# Patient Record
Sex: Male | Born: 1955 | Race: White | Hispanic: No | Marital: Married | Smoking: Never smoker
Health system: Southern US, Community
[De-identification: ages and names within clinical notes are randomized; demographics above are authoritative.]

## PROBLEM LIST (undated history)

## (undated) DIAGNOSIS — R748 Abnormal levels of other serum enzymes: Principal | ICD-10-CM

## (undated) DIAGNOSIS — R932 Abnormal findings on diagnostic imaging of liver and biliary tract: Principal | ICD-10-CM

## (undated) DIAGNOSIS — Z5181 Encounter for therapeutic drug level monitoring: Secondary | ICD-10-CM

## (undated) DIAGNOSIS — E871 Hypo-osmolality and hyponatremia: Principal | ICD-10-CM

## (undated) DIAGNOSIS — K8020 Calculus of gallbladder without cholecystitis without obstruction: Principal | ICD-10-CM

## (undated) DIAGNOSIS — I10 Essential (primary) hypertension: Secondary | ICD-10-CM

## (undated) DIAGNOSIS — K8010 Calculus of gallbladder with chronic cholecystitis without obstruction: Secondary | ICD-10-CM

## (undated) HISTORY — PX: ELBOW SURGERY: SHX618

---

## 2011-12-08 ENCOUNTER — Ambulatory Visit: Payer: Managed Care, Other (non HMO)

## 2011-12-08 ENCOUNTER — Ambulatory Visit: Payer: Managed Care, Other (non HMO) | Admitting: Family Medicine

## 2011-12-08 VITALS — BP 121/78 | HR 79 | Temp 98.1°F | Resp 16 | Ht 71.5 in | Wt 195.2 lb

## 2011-12-08 DIAGNOSIS — J189 Pneumonia, unspecified organism: Secondary | ICD-10-CM

## 2011-12-08 DIAGNOSIS — R059 Cough, unspecified: Secondary | ICD-10-CM

## 2011-12-08 DIAGNOSIS — R05 Cough: Secondary | ICD-10-CM

## 2011-12-08 MED ORDER — AZITHROMYCIN 250 MG PO TABS: ORAL_TABLET | ORAL | Status: AC

## 2011-12-08 MED ORDER — HYDROCODONE-HOMATROPINE 5-1.5 MG/5ML PO SYRP: 5.0000 mL | ORAL_SOLUTION | Freq: Three times a day (TID) | ORAL | Status: AC | PRN

## 2011-12-08 NOTE — Progress Notes (Signed)
@  [email or web address]@   Patient ID: Mark Larsen MRN: [id number], DOB: October 11, 1955, 56 y.o. Date of Encounter: 12/08/2011, 11:05 AM  Primary Physician: No primary provider on file.  Chief Complaint:  Chief Complaint  Patient presents with  . Headache    x 5 days  . Cough    green sputum little tight in chest    HPI: 56 y.o. year old male presents with a 5 day history of nasal congestion, post nasal drip, sore throat, and cough. Mild sinus pressure. Afebrile. No chills. Nasal congestion thick and green/yellow. Cough is productive of green/yellow sputum and not associated with time of day. Ears feel full, leading to sensation of muffled hearing. Has tried OTC cold preps without success. No GI complaints. Appetite fair  No sick contacts, recent antibiotics, or recent travels.   No leg trauma, sedentary periods, h/o cancer, or tobacco use.  No past medical history on file.   Home Meds: Prior to Admission medications   Medication Sig Start Date End Date Taking? Authorizing Provider  fish oil-omega-3 fatty acids 1000 MG capsule Take 2 g by mouth daily.   Yes Historical Provider, MD    Allergies: No Known Allergies  History   Social History  . Marital Status: Married    Spouse Name: N/A    Number of Children: N/A  . Years of Education: N/A   Occupational History  . Not on file.   Social History Main Topics  . Smoking status: Never Smoker   . Smokeless tobacco: Not on file  . Alcohol Use: Not on file  . Drug Use: Not on file  . Sexually Active: Not on file   Other Topics Concern  . Not on file   Social History Narrative  . No narrative on file     Review of Systems: Constitutional: negative for chills, fever, night sweats or weight changes Cardiovascular: negative for chest pain or palpitations Respiratory: negative for hemoptysis, or shortness of breath Abdominal: negative for abdominal pain, nausea, vomiting or diarrhea Dermatological: negative for rash Neurologic:  negative for headache   Physical Exam: Blood pressure 121/78, pulse 79, temperature 98.1 F (36.7 C), temperature source Oral, resp. rate 16, height 5' 11.5" (1.816 m), weight 195 lb 3.2 oz (88.542 kg), SpO2 98.00%., Body mass index is 26.85 kg/(m^2). General: Well developed, well nourished, in no acute distress. Head: Normocephalic, atraumatic, eyes without discharge, sclera non-icteric, nares are congested. Bilateral auditory canals clear, TM's are without perforation, pearly grey with reflective cone of light bilaterally. No sinus TTP. Oral cavity moist, dentition normal. Posterior pharynx with post nasal drip and mild erythema. No peritonsillar abscess or tonsillar exudate. Neck: Supple. No thyromegaly. Full ROM. No lymphadenopathy. Lungs: Decreased breath sounds bilaterally.  Rales left base Heart: RRR with S1 S2. No murmurs, rubs, or gallops appreciated. Msk:  Strength and tone normal for age. Extremities: No clubbing or cyanosis. No edema. Neuro: Alert and oriented X 3. Moves all extremities spontaneously. CNII-XII grossly in tact. Psych:  Responds to questions appropriately with a normal affect.   [facility] reading (PRIMARY) by  Dr. Milus Glazier: CXR-.left lower lobe infiltrate     ASSESSMENT AND PLAN:  56 y.o. year old male with bronchitis. 1. Cough  DG Chest 2 View, azithromycin (ZITHROMAX Z-PAK) 250 MG tablet, HYDROcodone-homatropine (HYCODAN) 5-1.5 MG/5ML syrup    - -Mucinex -Tylenol/Motrin prn -Rest/fluids -RTC precautions -RTC 3-5 days if no improvement  Signed, Elvina Sidle, MD 12/08/2011 11:05 AM

## 2012-06-13 ENCOUNTER — Ambulatory Visit: Payer: Managed Care, Other (non HMO) | Admitting: Emergency Medicine

## 2012-06-13 VITALS — BP 145/84 | HR 95 | Temp 98.3°F | Resp 18 | Ht 71.0 in | Wt 197.2 lb

## 2012-06-13 DIAGNOSIS — J209 Acute bronchitis, unspecified: Secondary | ICD-10-CM

## 2012-06-13 DIAGNOSIS — J018 Other acute sinusitis: Secondary | ICD-10-CM

## 2012-06-13 MED ORDER — HYDROCOD POLST-CHLORPHEN POLST 10-8 MG/5ML PO LQCR: 5.0000 mL | Freq: Two times a day (BID) | ORAL | Status: AC | PRN

## 2012-06-13 MED ORDER — AMOXICILLIN-POT CLAVULANATE 875-125 MG PO TABS: 1.0000 | ORAL_TABLET | Freq: Two times a day (BID) | ORAL | Status: DC

## 2012-06-13 MED ORDER — PSEUDOEPHEDRINE-GUAIFENESIN ER 60-600 MG PO TB12: 1.0000 | ORAL_TABLET | Freq: Two times a day (BID) | ORAL | Status: AC

## 2012-06-13 NOTE — Patient Instructions (Signed)

## 2012-06-13 NOTE — Progress Notes (Signed)
[facility] [address] [place] [address] [phone]  Date:  06/13/2012   Name:  Mark Larsen   DOB:  18-Jan-1956   MRN:  [id number]  PCP:  No primary provider on file.    Chief Complaint: Cough, Nasal Congestion and Sore Throat   History of Present Illness:  Quron Ruddy is a 57 y.o. very pleasant male patient who presents with the following:  1 week long upper respiratory infection. Has green nasal drainage and congestion over past two days.  Now has a cough productive purulent sputum.  No fever or chills. No shortness of breath or wheezing. No nausea or vomiting.  There is no problem list on file for this patient.   History reviewed. No pertinent past medical history.  History reviewed. No pertinent past surgical history.  History  Substance Use Topics  . Smoking status: Never Smoker   . Smokeless tobacco: Not on file  . Alcohol Use: Not on file    No family history on file.  No Known Allergies  Medication list has been reviewed and updated.  Current Outpatient Prescriptions on File Prior to Visit  Medication Sig Dispense Refill  . fish oil-omega-3 fatty acids 1000 MG capsule Take 2 g by mouth daily.       No current facility-administered medications on file prior to visit.    Review of Systems:  As per HPI, otherwise negative.    Physical Examination: Filed Vitals:   06/13/12 1243  BP: 145/84  Pulse: 95  Temp: 98.3 F (36.8 C)  Resp: 18   Filed Vitals:   06/13/12 1243  Height: 5\' 11"  (1.803 m)  Weight: 197 lb 3.2 oz (89.449 kg)   Body mass index is 27.52 kg/(m^2). Ideal Body Weight: Weight in (lb) to have BMI = 25: 178.9  GEN: WDWN, NAD, Non-toxic, A & O x 3 HEENT: Atraumatic, Normocephalic. Neck supple. No masses, No LAD. Ears and Nose: No external deformity. CV: RRR, No M/G/R. No JVD. No thrill. No extra heart sounds. PULM: CTA B, no wheezes, crackles, rhonchi. No retractions. No resp. distress. No accessory  muscle use. ABD: S, NT, ND, +BS. No rebound. No HSM. EXTR: No c/c/e NEURO Normal gait.  PSYCH: Normally interactive. Conversant. Not depressed or anxious appearing.  Calm demeanor.    Assessment and Plan: Sinusitis Bronchitis augmentin mucinex tussionex  Carmelina Dane, MD

## 2013-03-05 ENCOUNTER — Ambulatory Visit: Payer: Managed Care, Other (non HMO) | Admitting: Internal Medicine

## 2013-03-05 ENCOUNTER — Ambulatory Visit: Payer: Managed Care, Other (non HMO)

## 2013-03-05 VITALS — BP 122/80 | HR 80 | Temp 98.6°F | Resp 20 | Ht 71.0 in | Wt 197.8 lb

## 2013-03-05 DIAGNOSIS — J329 Chronic sinusitis, unspecified: Secondary | ICD-10-CM

## 2013-03-05 DIAGNOSIS — R918 Other nonspecific abnormal finding of lung field: Secondary | ICD-10-CM

## 2013-03-05 DIAGNOSIS — R9389 Abnormal findings on diagnostic imaging of other specified body structures: Secondary | ICD-10-CM

## 2013-03-05 MED ORDER — AMOXICILLIN 500 MG PO CAPS: 1000.0000 mg | ORAL_CAPSULE | Freq: Two times a day (BID) | ORAL | Status: AC

## 2013-03-05 NOTE — Patient Instructions (Signed)

## 2013-03-05 NOTE — Progress Notes (Signed)
  Subjective:    Patient ID: Mark Larsen, male    DOB: 13-Mar-1956, 57 y.o.   MRN: [id number]  HPI pt here c/o of sinusitis, headache, drainage (green, brown). Pt also has noticed some nose bleeding not to heavy.Started Saturday.  Has been taking Claritin d, and Mucinex. With no relief  Drinking and eating.  Denies fever, chills, or cough. No weight loss, anorexia, fatigue. Chart review abnormal cxr early this year see report  Review of Systems     Objective:   Physical Exam  Constitutional: He is oriented to person, place, and time. He appears well-developed and well-nourished.  HENT:  Head: Normocephalic.  Right Ear: External ear normal.  Left Ear: External ear normal.  Nose: Mucosal edema, rhinorrhea and sinus tenderness present. Right sinus exhibits frontal sinus tenderness. Right sinus exhibits no maxillary sinus tenderness. Left sinus exhibits frontal sinus tenderness. Left sinus exhibits no maxillary sinus tenderness.  Mouth/Throat: Oropharynx is clear and moist.  Eyes: Conjunctivae and EOM are normal. Pupils are equal, round, and reactive to light.  Neck: Normal range of motion. Neck supple.  Cardiovascular: Normal rate, normal heart sounds and intact distal pulses.   Pulmonary/Chest: Effort normal and breath sounds normal.  Musculoskeletal: Normal range of motion.  Lymphadenopathy:    He has no cervical adenopathy.  Neurological: He is alert and oriented to person, place, and time. No cranial nerve deficit. He exhibits normal muscle tone. Coordination normal.  Psychiatric: He has a normal mood and affect.      *RADIOLOGY REPORT*  Clinical Data: Cough  CHEST - 2 VIEW  Comparison: None.  Findings:  Normal cardiac silhouette and mediastinal contours. Lungs are  hyperexpanded with flattening of the bilateral hemidiaphragms.  Minimal left basilar heterogeneous opacities. No definite pleural  effusion or pneumothorax. Multilevel thoracic spine flowing  osteophytosis with  preservation of disc spaces, suggestive of DISH.  IMPRESSION:  1. Left basilar heterogeneous opacities, while possibly  atelectasis are worrisome for infection. A follow-up chest  radiograph in 4 to 6 weeks after treatment is recommended to ensure  resolution.  2. Hyperexpanded lungs.  Clinically significant discrepancy from primary report, if  provided: None  Original Report Authenticated  [facility] reading (PRIMARY) by  Dr.Allannah Kempen cxr now clear.      Assessment & Plan:  Sinusitis/Amoxil 1g BID Abnormal cxr now normal

## 2013-08-02 ENCOUNTER — Ambulatory Visit: Payer: Managed Care, Other (non HMO)

## 2015-03-27 ENCOUNTER — Ambulatory Visit (INDEPENDENT_AMBULATORY_CARE_PROVIDER_SITE_OTHER): Payer: 59 | Admitting: Physician Assistant

## 2015-03-27 VITALS — BP 120/72 | HR 66 | Temp 97.9°F | Resp 18 | Ht 71.0 in | Wt 194.0 lb

## 2015-03-27 DIAGNOSIS — J019 Acute sinusitis, unspecified: Secondary | ICD-10-CM | POA: Diagnosis not present

## 2015-03-27 MED ORDER — IPRATROPIUM BROMIDE 0.03 % NA SOLN: 2.0000 | Freq: Two times a day (BID) | NASAL | Status: AC

## 2015-03-27 MED ORDER — GUAIFENESIN ER 1200 MG PO TB12: 1.0000 | ORAL_TABLET | Freq: Two times a day (BID) | ORAL | Status: AC | PRN

## 2015-03-27 MED ORDER — AMOXICILLIN-POT CLAVULANATE 875-125 MG PO TABS: 1.0000 | ORAL_TABLET | Freq: Two times a day (BID) | ORAL | Status: AC

## 2015-03-27 NOTE — Progress Notes (Signed)
[facility] [address] [place] [address] [phone]  Date:  03/27/2015   Name:  Mark Larsen   DOB:  07/18/1955   MRN:  [id number]  PCP:  No primary care provider on file.   Chief Complaint  Patient presents with  . Sinusitis    x1 week   . Headache  . sinus pressure  . Nasal Congestion    yellow mucous     History of Present Illness:  Mark Larsen is a 59 y.o. male patient who presents to [facility] for cc of sinus pressure and headache for the last 4 days.  He has a burning sensation in the nostrils.  Yellow mucus.  Taking claritin D and flonase which helps.   There is no cough.  Uses a nedi pot, though not a lot regularly.  No fever.  He is travelling considerably by flight.    There are no active problems to display for this patient.   History reviewed. No pertinent past medical history.  Past Surgical History  Procedure Laterality Date  . Elbow surgery Right     Social History  Substance Use Topics  . Smoking status: Never Smoker   . Smokeless tobacco: None  . Alcohol Use: None    Family History  Problem Relation Age of Onset  . Heart disease Father     Daine Gip bipass, pacemaker    No Known Allergies  Medication list has been reviewed and updated.  Current Outpatient Prescriptions on File Prior to Visit  Medication Sig Dispense Refill  . fish oil-omega-3 fatty acids 1000 MG capsule Take 2 g by mouth daily.    Marland Kitchen loratadine-pseudoephedrine (CLARITIN-D 12-HOUR) 5-120 MG per tablet Take 1 tablet by mouth 2 (two) times daily.    Marland Kitchen amoxicillin (AMOXIL) 500 MG capsule Take 2 capsules (1,000 mg total) by mouth 2 (two) times daily. (Patient not taking: Reported on 03/27/2015) 40 capsule 1  . amoxicillin-clavulanate (AUGMENTIN) 875-125 MG per tablet Take 1 tablet by mouth 2 (two) times daily. (Patient not taking: Reported on 03/27/2015) 20 tablet 0  . chlorpheniramine-HYDROcodone (TUSSIONEX PENNKINETIC ER) 10-8 MG/5ML LQCR Take 5 mLs by  mouth every 12 (twelve) hours as needed (cough). (Patient not taking: Reported on 03/27/2015) 60 mL 0   No current facility-administered medications on file prior to visit.    ROS ROS otherwise unremarkable unless listed above.   Physical Examination: BP 120/72 mmHg  Pulse 66  Temp(Src) 97.9 F (36.6 C) (Oral)  Resp 18  Ht  (1.803 m)  Wt 194 lb (87.998 kg)  BMI 27.07 kg/m2  SpO2 99% Ideal Body Weight: Weight in (lb) to have BMI = 25: 178.9  Physical Exam  Constitutional: He is oriented to person, place, and time. He appears well-developed and well-nourished. No distress.  HENT:  Head: Atraumatic.  Right Ear: Tympanic membrane, external ear and ear canal normal.  Left Ear: Tympanic membrane, external ear and ear canal normal.  Nose: Mucosal edema and rhinorrhea present. Right sinus exhibits maxillary sinus tenderness. Right sinus exhibits no frontal sinus tenderness. Left sinus exhibits maxillary sinus tenderness. Left sinus exhibits no frontal sinus tenderness.  Mouth/Throat: No uvula swelling. No oropharyngeal exudate, posterior oropharyngeal edema or posterior oropharyngeal erythema.  Eyes: Conjunctivae, EOM and lids are normal. Pupils are equal, round, and reactive to light. Right eye exhibits normal extraocular motion. Left eye exhibits normal extraocular motion.  Neck: Trachea normal and full passive range of motion without pain. No edema and  no erythema present.  Cardiovascular: Normal rate.   Pulmonary/Chest: Effort normal. No respiratory distress. He has no decreased breath sounds. He has no wheezes. He has no rhonchi.  Neurological: He is alert and oriented to person, place, and time.  Skin: Skin is warm and dry. He is not diaphoretic.  Psychiatric: He has a normal mood and affect. His behavior is normal.     Assessment and Plan: Mark Larsen is a 59 y.o. male who is here today for cc of sinus pressure and nasal congestion. Treating for bacterial etiology.   Supportive treatment listed below.   Subacute sinusitis, unspecified location - Plan: Guaifenesin (MUCINEX MAXIMUM STRENGTH) 1200 MG TB12, amoxicillin-clavulanate (AUGMENTIN) 875-125 MG tablet, ipratropium (ATROVENT) 0.03 % nasal spray   Trena PlattStephanie Junious Ragone, PA-C [facility] 03/27/2015 11:26 AM

## 2015-03-27 NOTE — Patient Instructions (Signed)
Please use the nasal saline during the day.   Please use the medication as prescribed. Make sure that you are hydrating very well.    Sinusitis, Adult Sinusitis is redness, soreness, and inflammation of the paranasal sinuses. Paranasal sinuses are air pockets within the bones of your face. They are located beneath your eyes, in the middle of your forehead, and above your eyes. In healthy paranasal sinuses, mucus is able to drain out, and air is able to circulate through them by way of your nose. However, when your paranasal sinuses are inflamed, mucus and air can become trapped. This can allow bacteria and other germs to grow and cause infection. Sinusitis can develop quickly and last only a short time (acute) or continue over a long period (chronic). Sinusitis that lasts for more than 12 weeks is considered chronic. CAUSES Causes of sinusitis include:  Allergies.  Structural abnormalities, such as displacement of the cartilage that separates your nostrils (deviated septum), which can decrease the air flow through your nose and sinuses and affect sinus drainage.  Functional abnormalities, such as when the small hairs (cilia) that line your sinuses and help remove mucus do not work properly or are not present. SIGNS AND SYMPTOMS Symptoms of acute and chronic sinusitis are the same. The primary symptoms are pain and pressure around the affected sinuses. Other symptoms include:  Upper toothache.  Earache.  Headache.  Bad breath.  Decreased sense of smell and taste.  A cough, which worsens when you are lying flat.  Fatigue.  Fever.  Thick drainage from your nose, which often is green and may contain pus (purulent).  Swelling and warmth over the affected sinuses. DIAGNOSIS Your health care provider will perform a physical exam. During your exam, your health care provider may perform any of the following to help determine if you have acute sinusitis or chronic sinusitis:  Look in  your nose for signs of abnormal growths in your nostrils (nasal polyps).  Tap over the affected sinus to check for signs of infection.  View the inside of your sinuses using an imaging device that has a light attached (endoscope). If your health care provider suspects that you have chronic sinusitis, one or more of the following tests may be recommended:  Allergy tests.  Nasal culture. A sample of mucus is taken from your nose, sent to a lab, and screened for bacteria.  Nasal cytology. A sample of mucus is taken from your nose and examined by your health care provider to determine if your sinusitis is related to an allergy. TREATMENT Most cases of acute sinusitis are related to a viral infection and will resolve on their own within 10 days. Sometimes, medicines are prescribed to help relieve symptoms of both acute and chronic sinusitis. These may include pain medicines, decongestants, nasal steroid sprays, or saline sprays. However, for sinusitis related to a bacterial infection, your health care provider will prescribe antibiotic medicines. These are medicines that will help kill the bacteria causing the infection. Rarely, sinusitis is caused by a fungal infection. In these cases, your health care provider will prescribe antifungal medicine. For some cases of chronic sinusitis, surgery is needed. Generally, these are cases in which sinusitis recurs more than 3 times per year, despite other treatments. HOME CARE INSTRUCTIONS  Drink plenty of water. Water helps thin the mucus so your sinuses can drain more easily.  Use a humidifier.  Inhale steam 3-4 times a day (for example, sit in the bathroom with the shower running).  Apply a warm, moist washcloth to your face 3-4 times a day, or as directed by your health care provider.  Use saline nasal sprays to help moisten and clean your sinuses.  Take medicines only as directed by your health care provider.  If you were prescribed either an  antibiotic or antifungal medicine, finish it all even if you start to feel better. SEEK IMMEDIATE MEDICAL CARE IF:  You have increasing pain or severe headaches.  You have nausea, vomiting, or drowsiness.  You have swelling around your face.  You have vision problems.  You have a stiff neck.  You have difficulty breathing.   This information is not intended to replace advice given to you by your health care provider. Make sure you discuss any questions you have with your health care provider.   Document Released: 04/15/2005 Document Revised: 05/06/2014 Document Reviewed: 04/30/2011 Elsevier Interactive Patient Education [employer].

## 2021-10-18 ENCOUNTER — Ambulatory Visit
Admit: 2021-10-18 | Discharge: 2021-10-18 | Payer: PRIVATE HEALTH INSURANCE | Attending: Family Medicine | Primary: Family Medicine

## 2021-10-18 DIAGNOSIS — Z0000 Encounter for general adult medical examination without abnormal findings: Secondary | ICD-10-CM

## 2021-10-18 LAB — CBC WITH AUTO DIFFERENTIAL
Absolute Baso #: 0.1 10*3/uL (ref 0.0–0.2)
Absolute Eos #: 0.1 10*3/uL (ref 0.0–0.5)
Absolute Lymph #: 2.1 10*3/uL (ref 1.0–3.2)
Absolute Mono #: 0.5 10*3/uL (ref 0.3–1.0)
Basophils %: 1.1 % (ref 0.0–2.0)
Eosinophils %: 2.1 % (ref 0.0–7.0)
Hematocrit: 46.1 % (ref 38.0–52.0)
Hemoglobin: 16.2 g/dL (ref 13.0–17.3)
Immature Grans (Abs): 0.03 10*3/uL (ref 0.00–0.06)
Immature Granulocytes: 0.5 % (ref 0.0–0.6)
Lymphocytes: 32.7 % (ref 15.0–45.0)
MCH: 31.5 pg (ref 27.0–34.5)
MCHC: 35.1 g/dL (ref 32.0–36.0)
MCV: 89.7 fL (ref 84.0–100.0)
MPV: 10.6 fL (ref 7.2–13.2)
Monocytes: 7.7 % (ref 4.0–12.0)
NRBC Absolute: 0 10*3/uL (ref 0.000–0.012)
NRBC Automated: 0 % (ref 0.0–0.2)
Neutrophils %: 55.9 % (ref 42.0–74.0)
Neutrophils Absolute: 3.5 10*3/uL (ref 1.6–7.3)
Platelets: 243 10*3/uL (ref 140–440)
RBC: 5.14 x10e6/mcL (ref 4.00–5.60)
RDW: 12.6 % (ref 11.0–16.0)
WBC: 6.3 10*3/uL (ref 3.8–10.6)

## 2021-10-18 LAB — COMPREHENSIVE METABOLIC PANEL
ALT: 46 U/L (ref 0–50)
AST: 25 U/L (ref 0–50)
Albumin/Globulin Ratio: 2.6 (ref 1.00–2.70)
Albumin: 4.6 g/dL (ref 3.5–5.2)
Alk Phosphatase: 86 U/L (ref 40–130)
Anion Gap: 12 mmol/L (ref 2–17)
BUN: 15 mg/dL (ref 8–23)
CO2: 26 mmol/L (ref 22–29)
Calcium: 9 mg/dL (ref 8.8–10.2)
Chloride: 100 mmol/L (ref 98–107)
Creatinine: 1.1 mg/dL (ref 0.7–1.3)
Est, Glom Filt Rate: 75 mL/min/1.73m (ref >=60)
Globulin: 1.8 g/dL — ABNORMAL LOW (ref 1.9–4.4)
Glucose: 119 mg/dL — ABNORMAL HIGH (ref 70–99)
OSMOLALITY CALCULATED: 277 mOsm/kg (ref 270–287)
Potassium: 4.2 mmol/L (ref 3.5–5.3)
Sodium: 138 mmol/L (ref 135–145)
Total Bilirubin: 0.46 mg/dL (ref 0.00–1.20)
Total Protein: 6.4 g/dL (ref 6.4–8.3)

## 2021-10-18 LAB — TSH WITH REFLEX: TSH: 2.28 mcIU/mL (ref 0.358–3.740)

## 2021-10-18 LAB — LIPID PANEL
Chol/HDL Ratio: 5.6 — ABNORMAL HIGH (ref 0.0–4.4)
Cholesterol: 213 mg/dL — ABNORMAL HIGH (ref 100–200)
HDL: 38 mg/dL — ABNORMAL LOW (ref >=40)
LDL Cholesterol: 111.4 mg/dL — ABNORMAL HIGH (ref 0.0–100.0)
LDL/HDL Ratio: 2.9
Triglycerides: 318 mg/dL — ABNORMAL HIGH (ref 0–149)
VLDL: 63.6 mg/dL — ABNORMAL HIGH (ref 5.0–40.0)

## 2021-10-18 LAB — PSA SCREENING: Screening PSA: 1.01 ng/mL (ref 0.000–4.000)

## 2021-10-18 NOTE — Progress Notes (Signed)
Chief Complaint:     New Patient (Colon-never had- agreed to cologuard /Due for routine labs requesting PSA)         ASSESSMENT/PLAN:    ICD-10-CM    1. Well adult exam  Z00.00 CBC with Auto Differential     Comprehensive Metabolic Panel     Lipid Panel     TSH with Reflex (CERNER)     TSH with Reflex (CERNER)     Lipid Panel     Comprehensive Metabolic Panel     CBC with Auto Differential     Routine Venipuncture (16109)      2. Screening for malignant neoplasm of prostate  Z12.5 PSA Screening     PSA Screening      3. Screening for malignant neoplasm of colon  Z12.11 Cologuard (Fecal DNA Colorectal Cancer Screening)        Patient doing well. Blood pressure in reasonable range.  Check routine  labs. Continue current medications. Encouraged healthy eating and physical activity efforts. Cologuard ordered.       Return in about 1 year (around 10/19/2022) for CPE.         Subjective   SUBJECTIVE/OBJECTIVE:  Patient here for wellness exam.     States he has been eating healthy.     Also getting routine exercise.       ROS as per HPI or otherwise negative.           Objective   Vitals:    10/18/21 0849   BP: 136/84   Pulse: 80   SpO2: 98%       GENERAL: The patient is in no apparent distress. Alert and oriented. Vital Signs Reviewed HEENT: Head is normocephalic and atraumatic. Extraocular muscles are intact. Pupils are equal, Conjunctiva normal. Moist Mucous membranes. Posterior pharynx clear of any exudate or lesions. NECK: Supple. No Lymphadenopathy LUNGS: Clear to auscultation bilaterally. Breath Sounds equal. HEART: Regular rate and rhythm without murmur. No edema ABDOMEN: Soft, nontender, and nondistended. . Musculoskeletal: No deformity. Gait WNL. No swelling noted. NEUROLOGIC: Alert and Oriented. No focal deficits. PSYCHIATRIC: Cooperative, mood appropriate. SKIN: No rash noted.             An electronic signature was used to authenticate this note.    --Gabriel Cirri, MD

## 2021-11-05 LAB — FECAL DNA COLORECTAL CANCER SCREENING (COLOGUARD): FIT-DNA (Cologuard): NEGATIVE

## 2022-10-21 ENCOUNTER — Ambulatory Visit
Admit: 2022-10-21 | Discharge: 2022-10-21 | Payer: PRIVATE HEALTH INSURANCE | Attending: Family Medicine | Primary: Family Medicine

## 2022-10-21 DIAGNOSIS — Z0000 Encounter for general adult medical examination without abnormal findings: Secondary | ICD-10-CM

## 2022-10-21 LAB — LIPID PANEL
Chol/HDL Ratio: 4.8 — ABNORMAL HIGH (ref 0.0–4.4)
Cholesterol, Total: 205 mg/dL — ABNORMAL HIGH (ref 100–200)
HDL: 43 mg/dL (ref >=40)
LDL Cholesterol: 99 mg/dL (ref 0.0–100.0)
LDL/HDL Ratio: 2.3
Triglycerides: 315 mg/dL — ABNORMAL HIGH (ref 0–149)
VLDL: 63 mg/dL — ABNORMAL HIGH (ref 5.0–40.0)

## 2022-10-21 LAB — COMPREHENSIVE METABOLIC PANEL
ALT: 46 U/L (ref 0–50)
AST: 28 U/L (ref 0–50)
Albumin/Globulin Ratio: 2.4 (ref 1.00–2.70)
Albumin: 4.6 g/dL (ref 3.5–5.2)
Alk Phosphatase: 89 U/L (ref 40–130)
Anion Gap: 11 mmol/L (ref 2–17)
BUN: 13 mg/dL (ref 8–23)
CO2: 26 mmol/L (ref 22–29)
Calcium: 9.2 mg/dL (ref 8.5–10.7)
Chloride: 102 mmol/L (ref 98–107)
Creatinine: 1 mg/dL (ref 0.7–1.3)
Est, Glom Filt Rate: 84 mL/min/1.73m (ref >=60)
Globulin: 1.9 g/dL (ref 1.9–4.4)
Glucose: 105 mg/dL — ABNORMAL HIGH (ref 70–99)
Osmolaliy Calculated: 278 mOsm/kg (ref 270–287)
Potassium: 4.6 mmol/L (ref 3.5–5.3)
Sodium: 139 mmol/L (ref 135–145)
Total Bilirubin: 0.46 mg/dL (ref 0.00–1.20)
Total Protein: 6.5 g/dL (ref 5.7–8.3)

## 2022-10-21 LAB — CBC WITH AUTO DIFFERENTIAL
Basophils %: 0.9 % (ref 0.0–2.0)
Basophils Absolute: 0.1 10*3/uL (ref 0.0–0.2)
Eosinophils %: 1.9 % (ref 0.0–7.0)
Eosinophils Absolute: 0.1 10*3/uL (ref 0.0–0.5)
Hematocrit: 46.4 % (ref 38.0–52.0)
Hemoglobin: 16.1 g/dL (ref 13.0–17.3)
Immature Grans (Abs): 0.03 10*3/uL (ref 0.00–0.06)
Immature Granulocytes %: 0.4 % (ref 0.0–0.6)
Lymphocytes Absolute: 1.9 10*3/uL (ref 1.0–3.2)
Lymphocytes: 28 % (ref 15.0–45.0)
MCH: 30.9 pg (ref 27.0–34.5)
MCHC: 34.7 g/dL (ref 32.0–36.0)
MCV: 89.1 fL (ref 84.0–100.0)
MPV: 10.2 fL (ref 7.2–13.2)
Monocytes %: 7.7 % (ref 4.0–12.0)
Monocytes Absolute: 0.5 10*3/uL (ref 0.3–1.0)
NRBC Absolute: 0 10*3/uL (ref 0.000–0.012)
NRBC Automated: 0 % (ref 0.0–0.2)
Neutrophils %: 61.1 % (ref 42.0–74.0)
Neutrophils Absolute: 4.1 10*3/uL (ref 1.6–7.3)
Platelets: 254 10*3/uL (ref 140–440)
RBC: 5.21 x10e6/mcL (ref 4.00–5.60)
RDW: 13 % (ref 11.0–16.0)
WBC: 6.8 10*3/uL (ref 3.8–10.6)

## 2022-10-21 LAB — TSH WITH REFLEX: TSH: 2.57 mcIU/mL (ref 0.358–3.740)

## 2022-10-21 LAB — PSA SCREENING: PSA, Screening: 0.851 ng/mL (ref 0.000–4.000)

## 2022-10-21 NOTE — Progress Notes (Signed)
Well Adult Note  Name: Stephen Black [id number] Date: 10/21/2022   MRN: [id number] Sex: Male   Age: 67 y.o. Ethnicity: Non-Hispanic / Non Latino   DOB: 1955-08-20 Race: White (non-Hispanic)      Stephen Black is here for well adult exam.  History:  Patient has been doing well.     Notes he has been making effort to eat healthy and be active.     Elevated blood pressure reading initially here today. Denies previous history of elevated blood pressure.       Review of Systems    ROS as per HPI or otherwise negative.       No Known Allergies      Prior to Visit Medications    Not on File       History reviewed. No pertinent past medical history.    Past Surgical History:   Procedure Laterality Date    ELBOW SURGERY Right 1975         Family History   Problem Relation Age of Onset    Heart Disease Father     Diabetes Father        Social History     Tobacco Use    Smoking status: Never    Smokeless tobacco: Never   Vaping Use    Vaping Use: Never used   Substance Use Topics    Alcohol use: Yes    Drug use: Never       Objective     Vital Signs  BP (!) 152/80   Pulse 71   Ht 1.803 m (5\' 11" )   Wt 90.6 kg (199 lb 12.8 oz)   SpO2 97%   BMI 27.87 kg/m   Wt Readings from Last 3 Encounters:   10/21/22 90.6 kg (199 lb 12.8 oz)   10/18/21 90.8 kg (200 lb 3.2 oz)       Waist Circumference  There were no vitals filed for this visit.    Physical Exam       GENERAL: The patient is in no apparent distress. Alert and oriented. Vital Signs Reviewed HEENT: Head is normocephalic and atraumatic. Extraocular muscles are intact. Pupils are equal, Conjunctiva normal. Moist Mucous membranes. Posterior pharynx clear of any exudate or lesions. NECK: Supple. No Lymphadenopathy LUNGS: Clear to auscultation bilaterally. Breath Sounds equal. HEART: Regular rate and rhythm without murmur. No edema ABDOMEN: Soft, nontender, and nondistended. . Musculoskeletal: No deformity. Gait WNL. No swelling noted. NEUROLOGIC: Alert and Oriented. No focal deficits.  PSYCHIATRIC: Cooperative, mood appropriate. SKIN: No rash noted.         Assessment   Plan   1. Encounter for well adult exam without abnormal findings  -     CBC with Auto Differential; Future  -     Comprehensive Metabolic Panel; Future  -     Lipid Panel; Future  -     TSH with Reflex; Future  -     Routine Venipuncture (11914)  2. Screening for malignant neoplasm of prostate  -     PSA Screening; Future  3. Elevated blood pressure reading     Patient doing well. Blood pressure elevated.-- discussed having repeat blood pressure check with nursing in 1-2 weeks, if still elevated, will start anti-hypertensive. . Check labs. Continue current medications. Encouraged healthy eating and physical activity efforts.         Return in about 1 year (around 10/21/2023) for CPE.

## 2022-10-21 NOTE — Patient Instructions (Signed)
Well Visit, Ages 18 to 65: Care Instructions  Well visits can help you stay healthy. Your doctor has checked your overall health and may have suggested ways to take good care of yourself. Your doctor also may have recommended tests. You can help prevent illness with healthy eating, good sleep, vaccinations, regular exercise, and other steps.    Get the tests that you and your doctor decide on. Depending on your age and risks, examples might include screening for diabetes; hepatitis C; HIV; and cervical, breast, lung, and colon cancer. Screening helps find diseases before any symptoms appear.   Eat healthy foods. Choose fruits, vegetables, whole grains, lean protein, and low-fat dairy foods. Limit saturated fat and reduce salt.     Limit alcohol. Men should have no more than 2 drinks a day. Women should have no more than 1. For some people, no alcohol is the best choice.   Exercise. Get at least 30 minutes of exercise on most days of the week. Walking can be a good choice.     Reach and stay at your healthy weight. This will lower your risk for many health problems.   Take care of your mental health. Try to stay connected with friends, family, and community, and find ways to manage stress.     If you're feeling depressed or hopeless, talk to someone. A counselor can help. If you don't have a counselor, talk to your doctor.   Talk to your doctor if you think you may have a problem with alcohol or drug use. This includes prescription medicines, marijuana, and other drugs.     Avoid tobacco and nicotine: Don't smoke, vape, or chew. If you need help quitting, talk to your doctor.   Practice safer sex. Getting tested, using condoms or dental dams, and limiting sex partners can help prevent STIs.     Use birth control if it's important to you to prevent pregnancy. Talk with your doctor about your choices and what might be best for you.   Prevent problems where you can. Protect your skin from too much sun, wash your  hands, brush your teeth twice a day, and wear a seat belt in the car.   Where can you learn more?  Go to https://www.healthwise.net/patientEd and enter P072 to learn more about "Well Visit, Ages 18 to 65: Care Instructions."  Current as of: December 02, 2021  Content Version: 14.1   2006-2024 [employer].   Care instructions adapted under license by [facility]. If you have questions about a medical condition or this instruction, always ask your healthcare professional. Healthwise, Incorporated disclaims any warranty or liability for your use of this information.

## 2022-10-22 NOTE — Addendum Note (Signed)
Addended by: Gabriel Cirri on: 10/22/2022 07:18 AM     Modules accepted: Orders

## 2022-11-04 ENCOUNTER — Encounter: Admit: 2022-11-04 | Discharge: 2022-11-04 | Payer: PRIVATE HEALTH INSURANCE | Primary: Family Medicine

## 2022-11-04 DIAGNOSIS — R030 Elevated blood-pressure reading, without diagnosis of hypertension: Secondary | ICD-10-CM

## 2022-11-04 MED ORDER — AMLODIPINE BESYLATE 5 MG PO TABS
5 MG | ORAL_TABLET | Freq: Every day | ORAL | 0 refills | Status: AC
Start: 2022-11-04 — End: ?

## 2022-11-04 NOTE — Progress Notes (Signed)
BP elevated still discussed with Dr Laurence Compton who recommended having patient start Amlodipine 5mg  and fu in 2 weeks.   Patient agreed to this and follow up appt scheduled. Advised to call if any issues after starting medication.

## 2022-11-21 ENCOUNTER — Ambulatory Visit
Admit: 2022-11-21 | Discharge: 2022-11-21 | Payer: PRIVATE HEALTH INSURANCE | Attending: Family Medicine | Primary: Family Medicine

## 2022-11-21 DIAGNOSIS — I10 Essential (primary) hypertension: Secondary | ICD-10-CM

## 2022-11-21 NOTE — Progress Notes (Signed)
Chief Complaint:     Follow-up and Other (Elevated blood pressure. )      Assessment & Plan   ASSESSMENT/PLAN:    ICD-10-CM    1. Primary hypertension  I10         Blood pressure elevated. Discussed increasing amlodipine to 10 mg daily. He will seek re-eval if any concerns prior to next follow-up.     Return in about 2 weeks (around 12/05/2022).         Subjective   SUBJECTIVE/OBJECTIVE:  Patient here for blood pressure follow-up.     Notes he has been taking amlodipine consistently. And also has been changing lifestyle modifications.     ROS as per HPI or otherwise negative.           Objective   Vitals:    11/21/22 0957   BP: (!) 166/98   Pulse: 70   Resp: 18   SpO2: 97%       GENERAL: The patient is in no apparent distress. Alert and oriented. HEENT: Head is normocephalic and atraumatic. Extraocular muscles are intact. Pupils are equal, Conjunctiva normal.  NECK: Supple. No Lymphadenopathy LUNGS: Clear to auscultation bilaterally. Breath Sounds equal. HEART: Regular rate and rhythm without murmur. No edema  . Musculoskeletal: No deformity. Gait WNL. No swelling noted. NEUROLOGIC: Alert and Oriented. No focal deficits. PSYCHIATRIC: Cooperative, mood appropriate. SKIN: No rash noted.                 An electronic signature was used to authenticate this note.    --Gabriel Cirri, MD

## 2022-12-06 ENCOUNTER — Telehealth
Admit: 2022-12-06 | Discharge: 2022-12-06 | Payer: PRIVATE HEALTH INSURANCE | Attending: Family Medicine | Primary: Family Medicine

## 2022-12-06 DIAGNOSIS — I10 Essential (primary) hypertension: Secondary | ICD-10-CM

## 2022-12-06 NOTE — Progress Notes (Signed)
Stephen Black, was evaluated through a synchronous (real-time) audio-video encounter. The patient (or guardian if applicable) is aware that this is a billable service, which includes applicable co-pays. This Virtual Visit was conducted with patient's (and/or legal guardian's) consent. Patient identification was verified, and a caregiver was present when appropriate.   The patient was located at Home: [address]  [place] [address]  Provider was located at [employer] (Appt Dept): [address]  [address]  [address]  [place],  [place] [address]  Confirm you are appropriately licensed, registered, or certified to deliver care in the state where the patient is located as indicated above. If you are not or unsure, please re-schedule the visit: Yes, I confirm.     Stephen Black (DOB:  [birth year]) is a Established patient, presenting virtually for evaluation of the following:  Chief Complaint   Patient presents with    Other     Follow-up hypertension.          Assessment & Plan   Below is the assessment and plan developed based on review of pertinent history, physical exam, labs, studies, and medications.  1. Primary hypertension    Patient doing well. Blood pressure  improving, but borderline. Discussed having blood pressure checked manually by nursing to confirm and comparison reading for home blood pressure monitor.  Continue current amlodipine 10 mg. Will adjust meds based on manual reading. Discussed if side effects from amlodipine we can try alternative medication as well.     Continue to monitor blood pressure for now and seek re-eval if blood pressure is >140/90, consistently.       Return in about 6 weeks (around 01/17/2023).       Subjective   HPI    Patient here for follow-up of hypertension. Blood pressure has been elevated.     Taking amlodipine 10 mg.     Has been checking blood pressure notes 140s/90s.         Review of Systems   ROS as per HPI or otherwise negative.       Objective    Patient-Reported Vitals  No data recorded     Physical Exam  GENERAL APPEARANCE: well developed, well nourished, in no acute distress. HEAD: normocephalic, atraumatic. EYES: Pupils equal and round, conjuctiva clear, lids normal. EARS: Hearing grossly intact. NOSE: no external lesions. NECK: neck supple, normal chin to chest. LUNGS: Breathing comfortably, no audible wheezes on expiration by virtual visit.  SKIN: normal, no rash . NEUROLOGIC: CN grossly intact., cognitive exam grossly normal          --Gabriel Cirri, MD

## 2022-12-24 MED ORDER — VALSARTAN-HYDROCHLOROTHIAZIDE 160-12.5 MG PO TABS
160-12.5 | ORAL_TABLET | Freq: Every day | ORAL | 3 refills | Status: DC
Start: 2022-12-24 — End: 2023-04-24

## 2023-01-07 ENCOUNTER — Ambulatory Visit
Admit: 2023-01-07 | Discharge: 2023-01-07 | Payer: PRIVATE HEALTH INSURANCE | Attending: Family Medicine | Primary: Family Medicine

## 2023-01-07 VITALS — BP 128/78 | HR 73 | Ht 71.0 in | Wt 198.0 lb

## 2023-01-07 DIAGNOSIS — I10 Essential (primary) hypertension: Secondary | ICD-10-CM

## 2023-01-07 NOTE — Progress Notes (Signed)
 Chief Complaint:     Follow-up (2 weeks BP)      Assessment & Plan   ASSESSMENT/PLAN:    ICD-10-CM    1. Primary hypertension Controlled I10 Basic Metabolic Panel          Patient doing well. Blood pressure  controlled. Check labs to evaluate electrolytes and kidney function. Continue current medication.     Return in about 2 months (around 03/09/2023) for Cancel appt in June. .         Subjective   SUBJECTIVE/OBJECTIVE:  Patient here for hypertension follow-up.     Did not do well with amlodipine .     Tolerating valsartan  HCTZ well.     ROS as per HPI or otherwise negative.           Objective   Vitals:    01/07/23 1305   BP: 128/78   Pulse: 73   SpO2: 98%       GENERAL: The patient is in no apparent distress. Alert and oriented. HEENT: Head is normocephalic and atraumatic. Extraocular muscles are intact. Pupils are equal, Conjunctiva normal.   LUNGS: Clear to auscultation bilaterally. Breath Sounds equal. HEART: Regular rate and rhythm without murmur. No edema ABDOMEN: Soft, nontender, and nondistended. . Musculoskeletal: No deformity. Gait WNL. No swelling noted. NEUROLOGIC: Alert and Oriented. No focal deficits. PSYCHIATRIC: Cooperative, mood appropriate. SKIN: No rash noted.                 An electronic signature was used to authenticate this note.    --Lauraine CHRISTELLA Melody, MD

## 2023-03-11 ENCOUNTER — Encounter: Payer: PRIVATE HEALTH INSURANCE | Attending: Family Medicine | Primary: Family Medicine

## 2023-03-11 DIAGNOSIS — Z0000 Encounter for general adult medical examination without abnormal findings: Secondary | ICD-10-CM

## 2023-03-11 NOTE — Patient Instructions (Signed)
 Learning About Vision Tests  What are vision tests?     The four most common vision tests are visual acuity tests, refraction, visual field tests, and color vision tests.  Visual acuity (sharpness) tests  These tests are used:  To see if you need glas

## 2023-03-11 NOTE — Progress Notes (Signed)
 Medicare Annual Wellness Visit    Stephen Black is here for [facility] AWV    Chief Complaint   Patient presents with    Medicare AWV           Assessment & Plan   Medicare annual wellness visit, initial  -     Visual acuity screening  Primary hypertension  -

## 2023-03-12 LAB — HEMOGLOBIN A1C
Estimated Avg Glucose: 111
Estimated Avg Glucose: 118
Hemoglobin A1C: 5.5 % (ref 4.0–6.0)

## 2023-03-12 LAB — BASIC METABOLIC PANEL
Anion Gap: 9 mmol/L (ref 2–17)
BUN: 17 mg/dL (ref 8–23)
CO2: 30 mmol/L — ABNORMAL HIGH (ref 22–29)
Calcium: 8.9 mg/dL (ref 8.5–10.7)
Chloride: 96 mmol/L — ABNORMAL LOW (ref 98–107)
Creatinine: 1.1 mg/dL (ref 0.7–1.3)
Est, Glom Filt Rate: 75 mL/min/1.73mÂ² (ref >=60)
Glucose: 96 mg/dL (ref 70–99)
Osmolaliy Calculated: 271 mosm/kg (ref 270–287)
Potassium: 4.2 mmol/L (ref 3.5–5.3)
Sodium: 135 mmol/L (ref 135–145)

## 2023-04-25 MED ORDER — VALSARTAN-HYDROCHLOROTHIAZIDE 160-12.5 MG PO TABS
160-12.5 MG | ORAL_TABLET | Freq: Every day | ORAL | 3 refills | Status: DC
Start: 2023-04-25 — End: 2023-07-23

## 2023-07-23 ENCOUNTER — Encounter

## 2023-07-23 MED ORDER — VALSARTAN-HYDROCHLOROTHIAZIDE 160-12.5 MG PO TABS
160-12.5 | ORAL_TABLET | Freq: Every day | ORAL | 1 refills | 90.00000 days | Status: DC
Start: 2023-07-23 — End: 2024-01-11

## 2023-09-09 ENCOUNTER — Encounter: Attending: Family Medicine | Primary: Family Medicine

## 2023-10-09 ENCOUNTER — Ambulatory Visit: Admit: 2023-10-09 | Discharge: 2023-10-09 | Payer: MEDICARE | Attending: Family Medicine | Primary: Family Medicine

## 2023-10-09 VITALS — BP 128/82 | HR 76 | Ht 71.0 in | Wt 201.8 lb

## 2023-10-09 DIAGNOSIS — I10 Essential (primary) hypertension: Secondary | ICD-10-CM

## 2023-10-09 NOTE — Progress Notes (Signed)
 Chief Complaint:     Follow-up (6 months)  Blood pressure     Assessment & Plan   ASSESSMENT/PLAN:    ICD-10-CM    1. Primary hypertension Controlled I10 CBC with Auto Differential     Comprehensive Metabolic Panel     Lipid Panel     TSH reflex to FT4      2. Screening for malignant neoplasm of prostate  Z12.5 PSA Screening        Patient doing well. Blood pressure  controlled. Check labs after October 21, 2023. Continue current medication. Encouraged healthy eating and physical activity efforts.       Return in about 5 months (around 03/01/2024) for SAWV.         Subjective   SUBJECTIVE/OBJECTIVE:    Patient here for hypertension follow-up.     Notes he has been taking valsartan [place].     Notes he had episode of low blood pressure after working out without hydrating. Was also bending over and standing up.     ROS as per HPI or otherwise negative.           Objective   Vitals:    10/09/23 1133   BP: 128/82   Pulse: 76   SpO2: 98%       GENERAL: The patient is in no apparent distress. Alert and oriented.  HEENT: Head is normocephalic and atraumatic. Extraocular muscles are intact. Pupils are equal, Conjunctiva normal. Moist Mucous membranes. LUNGS: Clear to auscultation bilaterally. Breath Sounds equal. HEART: Regular rate and rhythm without murmur. No edema ABDOMEN: Soft, nontender, and nondistended.  Musculoskeletal: No deformity. Gait WNL. No swelling noted. NEUROLOGIC: Alert and Oriented. No focal deficits. PSYCHIATRIC: Cooperative, mood appropriate. SKIN: No rash noted.                 An electronic signature was used to authenticate this note.    --Otila Blizzard, MD

## 2023-10-21 ENCOUNTER — Encounter: Payer: MEDICARE | Attending: Family Medicine | Primary: Family Medicine

## 2023-11-28 ENCOUNTER — Other Ambulatory Visit: Admit: 2023-11-28 | Discharge: 2023-11-28 | Payer: MEDICARE | Primary: Family Medicine

## 2023-11-28 DIAGNOSIS — Z125 Encounter for screening for malignant neoplasm of prostate: Principal | ICD-10-CM

## 2023-11-28 LAB — CBC WITH AUTO DIFFERENTIAL
Basophils %: 0.9 % (ref 0.0–2.0)
Basophils Absolute: 0.1 x10e3/mcL (ref 0.0–0.2)
Eosinophils %: 1.9 % (ref 0.0–7.0)
Eosinophils Absolute: 0.1 x10e3/mcL (ref 0.0–0.5)
Hematocrit: 45.5 % (ref 38.0–52.0)
Hemoglobin: 15.7 g/dL (ref 13.0–17.3)
Immature Grans (Abs): 0.02 x10e3/mcL (ref 0.00–0.06)
Immature Granulocytes %: 0.3 % (ref 0.0–0.6)
Lymphocytes Absolute: 2.7 x10e3/mcL (ref 1.0–3.2)
Lymphocytes: 38.3 % (ref 15.0–45.0)
MCH: 31.5 pg (ref 27.0–34.5)
MCHC: 34.5 g/dL (ref 30.0–36.0)
MCV: 91.2 fL (ref 84.0–100.0)
MPV: 10.3 fL (ref 7.0–12.2)
Monocytes %: 8.2 % (ref 4.0–12.0)
Monocytes Absolute: 0.6 x10e3/mcL (ref 0.3–1.0)
NRBC Absolute: 0 x10e3/mcL (ref 0.000–0.012)
NRBC Automated: 0 % (ref 0.0–0.2)
Neutrophils %: 50.4 % (ref 42.0–74.0)
Neutrophils Absolute: 3.5 x10e3/mcL (ref 1.6–7.3)
Platelets: 270 x10e3/mcL (ref 140–440)
RBC: 4.99 x10e6/mcL (ref 4.00–5.60)
RDW: 12.8 % (ref 10.0–17.0)
WBC: 7 x10e3/mcL (ref 3.8–10.6)

## 2023-11-28 LAB — COMPREHENSIVE METABOLIC PANEL
ALT: 82 U/L — ABNORMAL HIGH (ref 0–42)
AST: 40 U/L (ref 0–46)
Albumin/Globulin Ratio: 2.2 (ref 1.00–2.70)
Albumin: 4.3 g/dL (ref 3.5–5.2)
Alk Phosphatase: 73 U/L (ref 40–130)
Anion Gap: 11 mmol/L (ref 2–17)
BUN: 12 mg/dL (ref 8–23)
CO2: 26 mmol/L (ref 22–29)
Calcium: 9.1 mg/dL (ref 8.5–10.7)
Chloride: 95 mmol/L — ABNORMAL LOW (ref 98–107)
Creatinine: 1.2 mg/dL (ref 0.7–1.3)
Est, Glom Filt Rate: 67 mL/min/1.73mÂ² (ref >=60)
Globulin: 2 g/dL (ref 1.9–4.4)
Glucose: 101 mg/dL — ABNORMAL HIGH (ref 70–99)
Osmolaliy Calculated: 264 mosm/kg — ABNORMAL LOW (ref 270–287)
Potassium: 4.3 mmol/L (ref 3.5–5.3)
Sodium: 132 mmol/L — ABNORMAL LOW (ref 135–145)
Total Bilirubin: 0.48 mg/dL (ref 0.00–1.20)
Total Protein: 6.3 g/dL (ref 5.7–8.3)

## 2023-11-28 LAB — LIPID PANEL
Chol/HDL Ratio: 4.9 — ABNORMAL HIGH (ref 0.0–4.4)
Cholesterol, Total: 185 mg/dL (ref 100–200)
HDL: 38 mg/dL — ABNORMAL LOW (ref >=40)
LDL Cholesterol: 104.4 mg/dL — ABNORMAL HIGH (ref 0.0–100.0)
LDL/HDL Ratio: 2.7
Triglycerides: 213 mg/dL — ABNORMAL HIGH (ref 0–149)
VLDL: 42.6 mg/dL — ABNORMAL HIGH (ref 5.0–40.0)

## 2023-11-28 LAB — PSA SCREENING: PSA, Screening: 1.85 ng/mL (ref 0.000–4.000)

## 2023-11-28 LAB — TSH REFLEX TO FT4: TSH: 2.93 u[IU]/mL (ref 0.358–3.740)

## 2023-12-04 ENCOUNTER — Encounter

## 2024-01-09 ENCOUNTER — Other Ambulatory Visit: Admit: 2024-01-09 | Discharge: 2024-01-09 | Payer: MEDICARE | Primary: Family Medicine

## 2024-01-09 DIAGNOSIS — E871 Hypo-osmolality and hyponatremia: Principal | ICD-10-CM

## 2024-01-10 ENCOUNTER — Encounter

## 2024-01-10 LAB — COMPREHENSIVE METABOLIC PANEL
ALT: 68 U/L — ABNORMAL HIGH (ref 0–42)
AST: 36 U/L (ref 0–46)
Albumin/Globulin Ratio: 2.2 (ref 1.00–2.70)
Albumin: 4.2 g/dL (ref 3.5–5.2)
Alk Phosphatase: 78 U/L (ref 40–130)
Anion Gap: 12 mmol/L (ref 2–17)
BUN: 13 mg/dL (ref 8–23)
CO2: 27 mmol/L (ref 22–29)
Calcium: 8.8 mg/dL (ref 8.5–10.7)
Chloride: 98 mmol/L (ref 98–107)
Creatinine: 1.3 mg/dL (ref 0.7–1.3)
Est, Glom Filt Rate: 61 mL/min/1.73m (ref >=60)
Globulin: 1.9 g/dL (ref 1.9–4.4)
Glucose: 104 mg/dL — ABNORMAL HIGH (ref 70–99)
Osmolaliy Calculated: 274 mosm/kg (ref 270–287)
Potassium: 4.3 mmol/L (ref 3.5–5.3)
Sodium: 137 mmol/L (ref 135–145)
Total Bilirubin: 0.3 mg/dL (ref 0.00–1.20)
Total Protein: 6.1 g/dL (ref 5.7–8.3)

## 2024-01-10 LAB — LIPID PANEL
Chol/HDL Ratio: 5.4 — ABNORMAL HIGH (ref 0.0–4.4)
Cholesterol, Total: 188 mg/dL (ref 100–200)
HDL: 35 mg/dL — ABNORMAL LOW (ref >=40)
LDL Cholesterol: 103.4 mg/dL — ABNORMAL HIGH (ref 0.0–100.0)
LDL/HDL Ratio: 3
Triglycerides: 248 mg/dL — ABNORMAL HIGH (ref 0–149)
VLDL: 49.6 mg/dL — ABNORMAL HIGH (ref 5.0–40.0)

## 2024-01-11 MED ORDER — VALSARTAN-HYDROCHLOROTHIAZIDE 160-12.5 MG PO TABS
160-12.5 | ORAL_TABLET | Freq: Every day | ORAL | 1 refills | 90.00000 days | Status: AC
Start: 2024-01-11 — End: ?

## 2024-01-12 ENCOUNTER — Encounter

## 2024-02-03 ENCOUNTER — Encounter

## 2024-02-03 ENCOUNTER — Inpatient Hospital Stay: Admit: 2024-02-03 | Payer: MEDICARE | Primary: Family Medicine

## 2024-02-03 DIAGNOSIS — R748 Abnormal levels of other serum enzymes: Principal | ICD-10-CM

## 2024-02-16 ENCOUNTER — Inpatient Hospital Stay: Admit: 2024-02-16 | Payer: MEDICARE | Attending: Family Medicine | Primary: Family Medicine

## 2024-02-16 DIAGNOSIS — R932 Abnormal findings on diagnostic imaging of liver and biliary tract: Principal | ICD-10-CM

## 2024-02-16 MED ORDER — IOPAMIDOL 61 % IV SOLN
61 | Freq: Once | INTRAVENOUS | Status: AC | PRN
Start: 2024-02-16 — End: 2024-02-16
  Administered 2024-02-16: 12:00:00 100 mL via INTRAVENOUS

## 2024-02-19 ENCOUNTER — Encounter

## 2024-02-19 NOTE — Telephone Encounter (Signed)
"  LVM for pt to call and schedule referral appt from Dr. Patton.SABRA  "

## 2024-03-01 ENCOUNTER — Ambulatory Visit: Admit: 2024-03-01 | Discharge: 2024-03-01 | Payer: MEDICARE | Attending: Surgery | Primary: Family Medicine

## 2024-03-01 NOTE — Progress Notes (Signed)
 "        Chief Complaint   Patient presents with    New Patient     GB       PCP: Patton Lauraine HERO, MD    03/01/2024  History of Present Illness  The patient is a 68 year old male who has been diagnosed with gallstones. The diagnosis was confirmed through an ultrasound and a CT scan with contrast, following the detection of elevated liver enzymes in his annual blood work. He is not experiencing any current pain, but he has had two episodes of abdominal discomfort over the past 12 years. Each episode was described as a sensation similar to consuming gasoline and quite severe. The patient is seeking a advice regarding cholecystectomy due to a family history of gallbladder issues, which includes his mother, two sisters, and daughter. He is currently taking psyllium husk, fish oil, and vitamin D supplements.    No past medical history on file.    Past Surgical History:   Procedure Laterality Date    ELBOW SURGERY Right 1975         Current Outpatient Medications:     valsartan -hydroCHLOROthiazide  (DIOVAN -HCT) 160-12.5 MG per tablet, TAKE 1 TABLET BY MOUTH EVERY DAY, Disp: 90 tablet, Rfl: 1     No Known Allergies    Social History     Socioeconomic History    Marital status: Married   Tobacco Use    Smoking status: Never    Smokeless tobacco: Never   Vaping Use    Vaping status: Never Used   Substance and Sexual Activity    Alcohol use: Yes    Drug use: Never    Sexual activity: Yes     Partners: Female     Social Drivers of Health     Physical Activity: Sufficiently Active (03/10/2023)    Exercise Vital Sign     Days of Exercise per Week: 3 days     Minutes of Exercise per Session: 50 min       Family History   Problem Relation Age of Onset    Heart Disease Father     Diabetes Father        Review of Systems   Constitutional:  Negative for activity change, fever and unexpected weight change.   HENT:  Negative for sore throat and voice change.    Respiratory:  Negative for shortness of breath.    Cardiovascular:  Negative  for chest pain and leg swelling.   Gastrointestinal:  Negative for abdominal distention, abdominal pain, constipation, diarrhea and nausea.   Genitourinary:  Negative for difficulty urinating.   Musculoskeletal:  Negative for gait problem.   Skin:  Negative for rash.   Neurological:  Negative for seizures, syncope and weakness.   Hematological:  Does not bruise/bleed easily.       Objective:  There were no vitals filed for this visit.       Physical Exam  Constitutional:       Appearance: Normal appearance.   HENT:      Head: Normocephalic and atraumatic.   Eyes:      General: No scleral icterus.  Cardiovascular:      Rate and Rhythm: Normal rate and regular rhythm.      Heart sounds: Normal heart sounds.   Pulmonary:      Effort: Pulmonary effort is normal.      Breath sounds: Normal breath sounds.   Abdominal:      General: Abdomen is flat. Bowel sounds  are normal. There is no distension.      Palpations: Abdomen is soft. There is no mass.      Tenderness: There is no abdominal tenderness. There is no guarding or rebound.      Hernia: No hernia is present.   Musculoskeletal:         General: Normal range of motion.      Cervical back: Neck supple.   Lymphadenopathy:      Cervical: No cervical adenopathy.   Skin:     General: Skin is warm and dry.      Coloration: Skin is not jaundiced.   Neurological:      Mental Status: He is alert and oriented to person, place, and time. Mental status is at baseline.   Psychiatric:         Mood and Affect: Mood normal.         Behavior: Behavior normal.       Assessment & Plan  1.  Chronic cholecystitis with cholelithiasis: Chronic.   - Elevated liver enzymes likely caused secondary inflammation of the liver  - Right upper quadrant ultrasound and CT imaging confirms gallstones.  (Images personally reviewed)  - Extensive cholelithiasis with likely chronic inflammation possible occlusion of cystic duct.  - Recommend proceeding cholecystectomy due to his history of biliary colic  and extensive cholelithiasis.  - Anticipate difficult dissection due to excessive scar tissue.  - Discussed risks: infection, bleeding, injury to surrounding structures, bile duct injury,subtotal procedure or need for further procedures  -   Follow-up  - Operation     Charlie DELENA [place] DOUGLAS, MD  "

## 2024-03-12 ENCOUNTER — Ambulatory Visit: Admit: 2024-03-12 | Discharge: 2024-03-12 | Payer: MEDICARE | Attending: Family Medicine | Primary: Family Medicine

## 2024-03-12 DIAGNOSIS — Z0000 Encounter for general adult medical examination without abnormal findings: Principal | ICD-10-CM

## 2024-03-12 NOTE — Patient Instructions (Signed)
 "     Advance Directives: Care Instructions  Overview  An advance directive is a legal way to state your wishes at the end of your life. It tells your loved ones and doctor what to do if you can't say what you want.  There are two main types of advance directives. You can change them any time your wishes change.  Living will. This form tells your loved ones and doctor your wishes about life support and other treatment. The form is also called a declaration.  Medical power of attorney. This form lets you name a person to make treatment decisions for you when you can't speak for yourself. This person is called a health care agent (health care proxy, health care surrogate). The form is also called a durable power of attorney for health care.  If you do not have an advance directive, decisions about your medical care may be made by a family member or doctor who doesn't know you or by a judge.  It may help to think of an advance directive as a gift to the people who care for you. If you have one, they won't have to make tough decisions by themselves.  For more information, including forms for your state, see the [employer] website (plumberbiz.com.cy).  Follow-up care is a key part of your treatment and safety. Be sure to make and go to all appointments, and call your doctor if you are having problems. It's also a good idea to know your test results and keep a list of the medicines you take.  What should you include in an advance directive?  Many states have a unique advance directive form. (It may ask you to address specific issues.) Or you might use a universal form that's approved by many states.  If your form doesn't tell you what to address, it may be hard to know what to include in your advance directive. Use the questions below to help you get started.  Who do you want to make decisions about your medical care if you are not able to?  What life-support measures do you want if you have a  serious illness that gets worse over time or can't be cured?  What are you most afraid of that might happen? (Maybe you're afraid of having pain, losing your independence, or being kept alive by machines.)  Where would you prefer to die? (Your home? A hospital? A nursing home?)  Do you want to donate your organs when you die?  Do you want certain religious practices performed before you die?  When should you call for help?  Be sure to contact your doctor if you have any questions.  Where can you learn more?  Go to [employer].ca and enter R264 to learn more about Advance Directives: Care Instructions.  Current as of: October 28, 2023  Content Version: 14.6   2024-2025 [place], [place].   Care instructions adapted under license by [facility]. If you have questions about a medical condition or this instruction, always ask your healthcare professional. Romayne Alderman, [facility], disclaims any warranty or liability for your use of this information.         A Healthy Heart: Care Instructions  Overview    Coronary artery disease, also called heart disease, occurs when a substance called plaque builds up in the vessels that supply oxygen-rich blood to your heart muscle. This can narrow the blood vessels and reduce blood flow. A heart attack happens when blood flow is completely  blocked. A high-fat diet, smoking, and other factors increase the risk of heart disease.  Your doctor has found that you have a chance of having heart disease. A heart-healthy lifestyle can help keep your heart healthy and prevent heart disease. This lifestyle includes eating healthy, being active, staying at a weight that's healthy for you, and not smoking, vaping, or using other tobacco or nicotine products. It also includes taking medicines as directed, managing other health conditions, and trying to get a healthy amount of sleep.  Follow-up care is a key part of your treatment and safety. Be sure to make and go to all  appointments, and contact your doctor if you are having problems. It's also a good idea to know your test results and keep a list of the medicines you take.  How can you care for yourself at home?  Diet  Use less salt when you cook and eat. This helps lower your blood pressure. Taste food before salting. Add only a little salt when you think you need it. With time, your taste buds will adjust to less salt.  Eat fewer snack items, fast foods, canned soups, and other high-salt, high-fat, processed foods.  Read food labels and try to avoid saturated and trans fats. They increase your risk of heart disease by raising cholesterol levels.  Limit the amount of solid fat--butter, margarine, and shortening--you eat. Use olive, peanut, or canola oil when you cook. Bake, broil, and steam foods instead of frying them.  Eat a variety of fruit and vegetables every day. Dark green, deep orange, red, or yellow fruits and vegetables are especially good for you. Examples include spinach, carrots, peaches, and berries.  Foods high in fiber can reduce your cholesterol and provide important vitamins and minerals. High-fiber foods include whole-grain cereals and breads, oatmeal, beans, brown rice, citrus fruits, and apples.  Eat lean proteins. Heart-healthy proteins include seafood, lean meats and poultry, eggs, beans, peas, nuts, seeds, and soy products.  Limit drinks and foods with added sugar. These include candy, desserts, and soda pop.  Heart-healthy lifestyle  If your doctor recommends it, get more exercise. For many people, walking is a good choice. Or you may want to swim, bike, or do other activities. Bit by bit, increase the time you're active every day. Try for at least 30 minutes on most days of the week.  If you smoke, vape, or use other tobacco or nicotine products, try to quit. If you cant quit, cut back as much as you can. If you need help quitting, talk to your doctor about quit programs and medicines. Quitting is one  of the most important things you can do to protect your heart. Also avoid secondhand smoke and the aerosol mist from vaping.  Stay at a weight that's healthy for you. Talk to your doctor if you need help losing weight.  Try to get 7 to 9 hours of sleep each night.  Limit alcohol to 2 drinks a day for men and 1 drink a day for women. Too much alcohol can cause health problems.  Manage other health problems such as diabetes, high blood pressure, and high cholesterol. If you think you may have a problem with alcohol or drug use, talk to your doctor.  Medicines  Take your medicines exactly as prescribed. Contact your doctor if you think you are having a problem with your medicine.  When should you call for help?  Call 911 if you have symptoms of a heart attack. These may  include:  Chest pain or pressure, or a strange feeling in the chest.  Sweating.  Shortness of breath.  Pain, pressure, or a strange feeling in the back, neck, jaw, or upper belly or in one or both shoulders or arms.  Lightheadedness or sudden weakness.  A fast or irregular heartbeat.  After you call 911, the operator may tell you to chew 1 adult-strength or 2 to 4 low-dose aspirin. Wait for an ambulance. Do not try to drive yourself.  Watch closely for changes in your health, and be sure to contact your doctor if you have any problems.  Where can you learn more?  Go to [employer].ca and enter F075 to learn more about A Healthy Heart: Care Instructions.  Current as of: November 27, 2022  Content Version: 14.6   2024-2025 [place], [place].   Care instructions adapted under license by [facility]. If you have questions about a medical condition or this instruction, always ask your healthcare professional. Romayne Alderman, [facility], disclaims any warranty or liability for your use of this information.    Personalized Preventive Plan for Stephen Black - 03/12/2024  Medicare offers a range of preventive health benefits. Some of the  tests and screenings are paid in full while other may be subject to a deductible, co-insurance, and/or copay.  Some of these benefits include a comprehensive review of your medical history including lifestyle, illnesses that may run in your family, and various assessments and screenings as appropriate.  After reviewing your medical record and screening and assessments performed today your provider may have ordered immunizations, labs, imaging, and/or referrals for you.  A list of these orders (if applicable) as well as your Preventive Care list are included within your After Visit Summary for your review.      "

## 2024-03-12 NOTE — Progress Notes (Signed)
"  Medicare Annual Wellness Visit    Stephen Black is here for Medicare AWV    Assessment & Plan   Initial Medicare annual wellness visit  Primary hypertension  Elevated LFTs      Patient doing well-- planning for cholecystectomy.  Blood pressure  controlled. Check labs. Continue current medications. Encouraged healthy eating and physical activity efforts. Keep specialist appointments as planned.              Return in about 6 months (around 09/09/2024).     Subjective     Patient with past medical history of hypertension here for wellness visit.      Has been taking blood pressure medication without issues.     Planning for cholecystectomy due to gall stones.        Patient's complete Health Risk Assessment and screening values have been reviewed and are found in Flowsheets. The following problems were reviewed today and where indicated follow up appointments were made and/or referrals ordered.    Positive Risk Factor Screenings with Interventions:                       Advanced Directives:  Do you have a Living Will?: (!) No    Intervention:  Discussed need for living will.             Objective   Vitals:    03/12/24 1032   BP: 128/78   Pulse: 64   Resp: 18   SpO2: 99%   Weight: 89.8 kg (198 lb)   Height: 1.803 m (5' 11)      Body mass index is 27.62 kg/m.         GENERAL: The patient is in no apparent distress. Alert and oriented. Vital Signs Reviewed HEENT: Head is normocephalic and atraumatic. Extraocular muscles are intact. Pupils are equal, Conjunctiva normal. Moist Mucous membranes. Posterior pharynx clear of any exudate or lesions. NECK: Supple. No Lymphadenopathy LUNGS: Clear to auscultation bilaterally. Breath Sounds equal. HEART: Regular rate and rhythm without murmur. No edema ABDOMEN: Soft, nontender, and nondistended. . Musculoskeletal: No deformity. Gait WNL. No swelling noted. NEUROLOGIC: Alert and Oriented. No focal deficits. PSYCHIATRIC: Cooperative, mood appropriate. SKIN: No rash noted.              No Known Allergies  Prior to Visit Medications   Medication Sig Taking? Authorizing Provider   valsartan -hydroCHLOROthiazide  (DIOVAN -HCT) 160-12.5 MG per tablet TAKE 1 TABLET BY MOUTH EVERY DAY Yes Zakira Ressel, Lauraine HERO, MD       CareTeam (Including outside providers/suppliers regularly involved in providing care):   Patient Care Team:  Patton Lauraine HERO, MD as PCP - General (Family Medicine)  Patton Lauraine HERO, MD as PCP - Empaneled Provider     Recommendations for Preventive Services Due: see orders and patient instructions/AVS.  Recommended screening schedule for the next 5-10 years is provided to the patient in written form: see Patient Instructions/AVS.     Reviewed and updated this visit:  Tobacco  Allergies  Meds  Problems  Med Hx  Surg Hx  Fam Hx               "

## 2024-04-06 NOTE — Progress Notes (Signed)
 "Pre Procedure Patient Instructions     Procedure Location hospital:[facility]: [address] [place] - Park in front of Hospital Main Entrance and check in at [employer].    Procedure Date 12/16  Arrival Time 0830 AM    Your doctor determines your scheduled start time.  Depending on the facility where your procedure is taking place and the scheduled start time, you may be required to arrive up to 2 hours prior.  This is to allow time for registration, your preop assessment, any day of procedure testing and to meet with your care teams members.  This also allows your procedure to be performed earlier should there be a cancellation that day.  We appreciate your patience as we work to provide you with excellent service.    If you have any scheduling concerns or procedure questions (including questions after your procedure) please contact your physicians office. [place] Charlie DELENA DOUGLAS, MD  Phone Number: [phone]     If you have questions on the day of your procedure call [facility] Preop at [phone].    Medications: Follow the medication instructions below to prevent your procedure from being cancelled.    Medication to be taken the morning of surgery with a few sips of water only: NONE    Continue to take your medications as prescribed, with the following exceptions:     Hold or reduce the dosage of the following medication, as discussed:     Take VALSARTAN -HYDROCHLOROTHIAZIDE  as you normally would the day before your procedure. DO NOT take the morning of procedure.    Stop all supplements, vitamins and herbal remedies one week prior to your procedure.  LAST DOSE 12/08    Do not take over the counter pain medications except plain Tylenol or Acetaminophen for seven days prior to your procedure unless your doctor told you otherwise.    If you are taking any diabetes and or weight loss medications (including injectables and shots) not discussed during the call with the  PreAdmission Nurse, it is important to call [phone] to discuss important instructions.    If you are taking blood thinners, and have not been told when to hold the dose, call the doctor performing your procedure for instructions on when to stop.    On the day of your procedure, a nurse will review your medications and ask when you last took each one.        Procedure Preparation    Diet Restrictions:No food or drink including gum or mints -after midnight the day of your procedure.       Skin Preparation:  Wash with Hibiclens or an antibacterial soap (e.g. Dial soap) the night before and morning of procedure.  Using a clean washcloth, wash from your neck to your toes for 3 minutes. Avoid your face, eyes, ears, head or genitals.    Do not put on any deodorants, lotions, powders, or oils on the day of procedure.  Be sure to put on clean, comfortable loose fitting clothing.    Other Preparation:  Call your doctor right away if you get any wounds, cuts, scrapes, scabs, rashes, bug bites at or near your procedure site or if you have any fever, cold or flu symptoms    Complete the following tests/lab work, Potassium Level,  for your procedure by 12/15 at [facility]: [address] [place]; M-F 7a-4:30p; Sat-Sun: 7a-12p; Their phone number is [phone] if needed.  If needing an EKG completed please call  [phone] Option 2 and then Option 1 to schedule.  Outpatient Entrance; You do not need to fast. Bring your picture ID and insurance card. Inform the receptionist you are there for testing before your surgery. Tell them the ordering provider is DR. STEADMAN and the order date/time is  .  If you have any issue, call [phone]  Complete the following tests/lab work, Potassium Level, for your procedure by 12/15 at [facility]: [address] 3rd floor [place]; M-F 7a-5p; Their phone number is [phone] if needed. You do not need to fast. Bring your picture ID and insurance  card. Inform the receptionist you are there for testing before your surgery. Tell them the ordering provider is DR. STEADMAN and the order date/time is  .  If you have any issue, call [phone]        Day of Procedure Patient Instruction:  Remove all jewelry, piercings, and metal accessories.  Do not smoke, vape, chew tobacco, drink alcohol or use recreational drugs on the day of your procedure  Do not wear artificial nails and only clear nail polish on natural nails. Nails must be trimmed to fingertip length to make sure the oxygen probe fits properly and to avoid injury  Do not wear artificial eye lashes because they can cause dry eyes, eye scratches, eye infections and allergic reactions  Do not use hair extensions with metal clips or hairstyles near the back of your neck, as it can make it difficult to safely manage your breathing during anesthesia  If your hair is tightly braided or styled in a complex way, it may need to be undone for your safety  Do not wear contacts, tampons, make-up, lotions, creams, powders, fragrances or deodorant  Wear loose fit clothing  Do not bring valuables or money  Bring a copy of your Living Will and/or Medical Durable Power of Attorney if you have one  Bring a list of current medications including name and dosage  Bring a picture ID and insurance card  Bring a storage case for eyeglasses, hearing aids, dentures, etc          If you are going home the same day as your procedure, a support person should accompany you to the facility and must transport you home.  If you plan to take public transportation of any sort, your support person must accompany you home.  You should have someone to stay with you for 24 hours after your procedure with sedation of any kind.       Comments:     This information was reviewed with you during your Pre-Admission Testing interview and you verbalized understanding. If you have any additional questions please contact [phone].    To pre-register  for your procedure OR for billing estimate questions please call [phone] Option 2 and then Option 3.    For financial questions AFTER your procedure is complete please contact [phone].    For financial questions regarding anesthesia at a [facility] facility, please  contact [phone].    Printing these instructions from your mobile device: Select the 3 vertical dots next to Messages and Print should appear at the bottom of your screen.  Select your printer and print.     Printing these instructions from your computer/laptop: Select the Messages tab towards the top of your screen.  Locate the Pre Procedure Instructions message, right click in the body of the message and select Print.     For MyChart Patient Portal  help please call [phone].    For hotel accommodations please visit [email or web address] and select PATIENTS &VISITORS -> FOR VISITORS section and then TRAVEL INFORMATION -> PLACES TO STAY        "

## 2024-04-12 ENCOUNTER — Encounter

## 2024-04-12 LAB — POTASSIUM: Potassium: 4.3 mmol/L (ref 3.5–5.3)

## 2024-04-13 ENCOUNTER — Inpatient Hospital Stay: Payer: MEDICARE

## 2024-04-13 MED ORDER — OXYCODONE HCL 5 MG PO TABS
5 | ORAL | Status: DC | PRN
Start: 2024-04-13 — End: 2024-04-13

## 2024-04-13 MED ORDER — PROPOFOL 200 MG/20ML IV EMUL
200 | INTRAVENOUS | Status: AC
Start: 2024-04-13 — End: 2024-04-13

## 2024-04-13 MED ORDER — INDOCYANINE GREEN 25 MG IV SOLR
25 | INTRAVENOUS | Status: DC
Start: 2024-04-13 — End: 2024-04-13

## 2024-04-13 MED ORDER — PHENYLEPHRINE HCL 1 MG/10ML IV SOSY
1 | INTRAVENOUS | Status: AC
Start: 2024-04-13 — End: 2024-04-13

## 2024-04-13 MED ORDER — PROPOFOL 200 MG/20ML IV EMUL
200 | Freq: Once | INTRAVENOUS | Status: DC | PRN
Start: 2024-04-13 — End: 2024-04-13
  Administered 2024-04-13: 16:00:00 200 via INTRAVENOUS

## 2024-04-13 MED ORDER — SUGAMMADEX SODIUM 200 MG/2ML IV SOLN
200 | Freq: Once | INTRAVENOUS | Status: DC | PRN
Start: 2024-04-13 — End: 2024-04-13
  Administered 2024-04-13: 17:00:00 200 via INTRAVENOUS

## 2024-04-13 MED ORDER — LACTATED RINGERS IV SOLN
INTRAVENOUS | Status: DC | PRN
Start: 2024-04-13 — End: 2024-04-13
  Administered 2024-04-13: 16:00:00 via INTRAVENOUS

## 2024-04-13 MED ORDER — FENTANYL CITRATE (PF) 100 MCG/2ML IJ SOLN
100 | INTRAMUSCULAR | Status: AC
Start: 2024-04-13 — End: 2024-04-13

## 2024-04-13 MED ORDER — LIDOCAINE HCL (PF) 2 % IJ SOLN
2 | INTRAMUSCULAR | Status: AC
Start: 2024-04-13 — End: 2024-04-13

## 2024-04-13 MED ORDER — FENTANYL CITRATE (PF) 100 MCG/2ML IJ SOLN
100 | Freq: Once | INTRAMUSCULAR | Status: DC | PRN
Start: 2024-04-13 — End: 2024-04-13
  Administered 2024-04-13: 16:00:00 100 via INTRAVENOUS

## 2024-04-13 MED ORDER — ONDANSETRON HCL 4 MG/2ML IJ SOLN
4 | Freq: Once | INTRAMUSCULAR | Status: DC | PRN
Start: 2024-04-13 — End: 2024-04-13

## 2024-04-13 MED ORDER — ROCURONIUM BROMIDE 50 MG/5ML IV SOLN
50 | INTRAVENOUS | Status: AC
Start: 2024-04-13 — End: 2024-04-13

## 2024-04-13 MED ORDER — CEFAZOLIN SODIUM 2 G SOLR (MIXTURES ONLY)
2 | INTRAVENOUS | Status: AC
Start: 2024-04-13 — End: 2024-04-13
  Administered 2024-04-13: 16:00:00 2000 mg via INTRAVENOUS

## 2024-04-13 MED ORDER — LIDOCAINE HCL (PF) 2 % IJ SOLN
2 | Freq: Once | INTRAMUSCULAR | Status: DC | PRN
Start: 2024-04-13 — End: 2024-04-13
  Administered 2024-04-13: 16:00:00 100 via INTRAVENOUS

## 2024-04-13 MED ORDER — NALOXONE HCL 0.4 MG/ML IJ SOLN
0.4 | INTRAMUSCULAR | Status: DC | PRN
Start: 2024-04-13 — End: 2024-04-13

## 2024-04-13 MED ORDER — ONDANSETRON HCL 4 MG/2ML IJ SOLN
4 | INTRAMUSCULAR | Status: AC
Start: 2024-04-13 — End: 2024-04-13

## 2024-04-13 MED ORDER — HYDROMORPHONE HCL 1 MG/ML IJ SOLN
1 | INTRAMUSCULAR | Status: DC | PRN
Start: 2024-04-13 — End: 2024-04-13

## 2024-04-13 MED ORDER — FENTANYL CITRATE (PF) 100 MCG/2ML IJ SOLN
100 | INTRAMUSCULAR | Status: DC | PRN
Start: 2024-04-13 — End: 2024-04-13

## 2024-04-13 MED ORDER — BUPIVACAINE-EPINEPHRINE 0.5% -1:200000 IJ SOLN
INTRAMUSCULAR | Status: DC | PRN
Start: 2024-04-13 — End: 2024-04-13
  Administered 2024-04-13: 17:00:00 30 via INTRADERMAL

## 2024-04-13 MED ORDER — DEXAMETHASONE SODIUM PHOSPHATE 4 MG/ML IJ SOLN
4 | Freq: Once | INTRAMUSCULAR | Status: DC | PRN
Start: 2024-04-13 — End: 2024-04-13
  Administered 2024-04-13: 16:00:00 4 via INTRAVENOUS

## 2024-04-13 MED ORDER — SODIUM CHLORIDE 0.9 % IV SOLN
0.9 | INTRAVENOUS | Status: DC | PRN
Start: 2024-04-13 — End: 2024-04-13

## 2024-04-13 MED ORDER — NORMAL SALINE FLUSH 0.9 % IV SOLN
0.9 | INTRAVENOUS | Status: DC | PRN
Start: 2024-04-13 — End: 2024-04-13

## 2024-04-13 MED ORDER — PROCHLORPERAZINE EDISYLATE 10 MG/2ML IJ SOLN
10 | Freq: Once | INTRAMUSCULAR | Status: DC | PRN
Start: 2024-04-13 — End: 2024-04-13

## 2024-04-13 MED ORDER — PHENYLEPHRINE HCL 1 MG/10ML IV SOSY
1 | Freq: Once | INTRAVENOUS | Status: DC | PRN
Start: 2024-04-13 — End: 2024-04-13
  Administered 2024-04-13: 16:00:00 200 via INTRAVENOUS
  Administered 2024-04-13: 17:00:00 100 via INTRAVENOUS
  Administered 2024-04-13 (×2): 200 via INTRAVENOUS
  Administered 2024-04-13: 16:00:00 100 via INTRAVENOUS

## 2024-04-13 MED ORDER — BUPIVACAINE-EPINEPHRINE (PF) 0.5% -1:200000 IJ SOLN
INTRAMUSCULAR | Status: AC
Start: 2024-04-13 — End: 2024-04-13

## 2024-04-13 MED ORDER — NORMAL SALINE FLUSH 0.9 % IV SOLN
0.9 | Freq: Two times a day (BID) | INTRAVENOUS | Status: DC
Start: 2024-04-13 — End: 2024-04-13

## 2024-04-13 MED ORDER — DEXAMETHASONE SODIUM PHOSPHATE 4 MG/ML IJ SOLN
4 | INTRAMUSCULAR | Status: AC
Start: 2024-04-13 — End: 2024-04-13

## 2024-04-13 MED ORDER — MIDAZOLAM HCL 2 MG/2ML IJ SOLN
2 | INTRAMUSCULAR | Status: AC
Start: 2024-04-13 — End: 2024-04-13

## 2024-04-13 MED ORDER — ROCURONIUM BROMIDE 50 MG/5ML IV SOLN
50 | Freq: Once | INTRAVENOUS | Status: DC | PRN
Start: 2024-04-13 — End: 2024-04-13
  Administered 2024-04-13: 17:00:00 10 via INTRAVENOUS
  Administered 2024-04-13: 16:00:00 50 via INTRAVENOUS

## 2024-04-13 MED ORDER — OXYCODONE HCL 5 MG PO TABS
5 | ORAL_TABLET | Freq: Four times a day (QID) | ORAL | 0 refills | Status: AC | PRN
Start: 2024-04-13 — End: 2024-04-16

## 2024-04-13 MED ORDER — MIDAZOLAM HCL 2 MG/2ML IJ SOLN
2 | Freq: Once | INTRAMUSCULAR | Status: DC | PRN
Start: 2024-04-13 — End: 2024-04-13
  Administered 2024-04-13: 16:00:00 2 via INTRAVENOUS

## 2024-04-13 MED ORDER — HYDROMORPHONE HCL 2 MG/ML IJ SOLN
2 | INTRAMUSCULAR | Status: AC
Start: 2024-04-13 — End: 2024-04-13

## 2024-04-13 MED ORDER — SUGAMMADEX SODIUM 200 MG/2ML IV SOLN
200 | INTRAVENOUS | Status: AC
Start: 2024-04-13 — End: 2024-04-13

## 2024-04-13 MED ORDER — DIPHENHYDRAMINE HCL 50 MG/ML IJ SOLN
50 | Freq: Once | INTRAMUSCULAR | Status: DC | PRN
Start: 2024-04-13 — End: 2024-04-13

## 2024-04-13 MED ORDER — HYDROMORPHONE HCL 2 MG/ML IJ SOLN
2 | Freq: Once | INTRAMUSCULAR | Status: DC | PRN
Start: 2024-04-13 — End: 2024-04-13
  Administered 2024-04-13: 16:00:00 .4 via INTRAVENOUS

## 2024-04-13 MED ORDER — ONDANSETRON HCL 4 MG/2ML IJ SOLN
4 | Freq: Once | INTRAMUSCULAR | Status: DC | PRN
Start: 2024-04-13 — End: 2024-04-13
  Administered 2024-04-13: 16:00:00 4 via INTRAVENOUS

## 2024-04-13 MED ORDER — HYDRALAZINE HCL 20 MG/ML IJ SOLN
20 | INTRAMUSCULAR | Status: DC | PRN
Start: 2024-04-13 — End: 2024-04-13

## 2024-04-13 MED FILL — ROCURONIUM BROMIDE 50 MG/5ML IV SOLN: 50 MG/5ML | INTRAVENOUS | Qty: 5

## 2024-04-13 MED FILL — PROPOFOL 200 MG/20ML IV EMUL: 200 MG/20ML | INTRAVENOUS | Qty: 20

## 2024-04-13 MED FILL — MARCAINE/EPINEPHRINE PF 0.5% -1:200000 IJ SOLN: INTRAMUSCULAR | Qty: 30

## 2024-04-13 MED FILL — PHENYLEPHRINE HCL (PRESSORS) 1 MG/10ML IV SOSY: 1 MG/0ML | INTRAVENOUS | Qty: 10

## 2024-04-13 MED FILL — ONDANSETRON HCL 4 MG/2ML IJ SOLN: 4 MG/2ML | INTRAMUSCULAR | Qty: 2

## 2024-04-13 MED FILL — MIDAZOLAM HCL 2 MG/2ML IJ SOLN: 2 mg/mL | INTRAMUSCULAR | Qty: 2

## 2024-04-13 MED FILL — CEFAZOLIN SODIUM 2 G IJ SOLR: 2 g | INTRAMUSCULAR | Qty: 2000

## 2024-04-13 MED FILL — HYDROMORPHONE HCL 2 MG/ML IJ SOLN: 2 mg/mL | INTRAMUSCULAR | Qty: 1

## 2024-04-13 MED FILL — LIDOCAINE HCL (PF) 2 % IJ SOLN: 2 % | INTRAMUSCULAR | Qty: 5

## 2024-04-13 MED FILL — FENTANYL CITRATE (PF) 100 MCG/2ML IJ SOLN: 100 MCG/2ML | INTRAMUSCULAR | Qty: 2

## 2024-04-13 MED FILL — INDOCYANINE GREEN 25 MG IV SOLR: 25 mg | INTRAVENOUS | Qty: 10

## 2024-04-13 MED FILL — DEXAMETHASONE SODIUM PHOSPHATE 4 MG/ML IJ SOLN: 4 mg/mL | INTRAMUSCULAR | Qty: 1

## 2024-04-13 MED FILL — BRIDION 200 MG/2ML IV SOLN: 200 MG/2ML | INTRAVENOUS | Qty: 2

## 2024-04-13 NOTE — Op Note (Signed)
 "Operative Note      Patient: Stephen Black  Date of Birth: 1955/08/04  MRN: [id number]    Date of Procedure:   04/13/2024    Indication for Surgery:  68 year old male with elevated liver enzymes.  Imaging demonstrated severe extensive cholelithiasis with chronic cholecystitis.  Recommend proceeding with cholecystectomy.  We discussed the risk and benefits of the procedure and he agrees and wished proceed.    Pre-Op Diagnosis:   Cholelithiasis [K80.20]    Post-Op Diagnosis:   Same       Procedure(s):  CHOLECYSTECTOMY LAPAROSCOPIC ROBOTIC DV5  HERNIA UMBILICAL REPAIR    Surgeon(s):  [place] Charlie DELENA DOUGLAS, MD    Assistant:   First Assistant: Veverly Courser    Anesthesia:   General    Estimated Blood Loss:   5mL    Complications:   None    Specimens:   ID Type Source Tests Collected by Time Destination   A : Gallbladder Tissue Gallbladder SURGICAL PATHOLOGY [place] Charlie DELENA DOUGLAS, MD 04/13/2024 1154        Implants:  Implant Name Type Inv. Item Serial No. Manufacturer Lot No. LRB No. Used Action   CLIP LIG MED LG CARTRIDGE INT NON-ABSORBABLE VESOLOCK - SNA  CLIP LIG MED LG CARTRIDGE INT NON-ABSORBABLE VESOLOCK NA TELEFLEX MEDICAL-WD  N/A 1 Implanted         Drains:   * No LDAs found *    Findings:   Severe chronic cholecystitis with extensive cholelithiasis.  Primary umbilical hernia repair.    Detailed Description of Procedure:  After informed consent was obtained the patient was brought to the operating room placed in supine position.  He underwent general endotracheal anesthesia.  His abdomen was prepped and draped in normal sterile fashion with ChloraPrep.  Ancef  was administered.  A standard timeout was performed.    Optical entry technique was performed with a Fios trocar just off the left costal margin.  The abdomen was insufflated and inspected.  There were no injuries from entry.  8 mm robotic trocars were placed at the level of the umbilicus through the rectus muscles on the left and the right.  Another 8 mm  robotic trocar was placed to the right lateral abdomen.  The original trocar was upsized to a 12 mm trocar and a suture passer and 0 Vicryl figure-of-eight stay suture was placed.  The patient was placed in reverse Trendelenburg and rotated to the left.  The robot was docked.    Examination of the anterior abdominal wall revealed an incarcerated umbilical hernia containing omentum.  Initial plan was to leave this alone however it did appear that after portion of it partially reduced that it would be at risk for an acute incarceration and strangulation in the future.  An open primary repair through a separate incision was performed at the end of the operation.      Examination of the upper abdomen revealed a fairly normal-appearing liver.  The gallbladder had mounding omentum that was taken down easily.  The gallbladder was severely distended and enlarged.  A portion of the fundus and posterior gallbladder wall was intrahepatic.  The gallbladder wall was pale in color indicative of chronic cholecystitis.  The gallbladder was tense and difficult to grasp.  The size of the gallbladder made retraction cephalad very challenging against the costal margin.  But retraction was able to be performed to show the infundibulum.  The sulcus of Rouviere and base of segment 4 were identified and a line  of safety was created between these 2 points.  ICG cholangiography was used to confirm the anatomy.  The common bile duct could be seen in the porta.  The cystic duct could easily be seen terminating into the infundibulum.  The cystic artery was identified with an enlarged cystic artery lymph node.  The infundibulum was grasped and retracted laterally.  The peritoneum over the infundibulum was opened medially and laterally.  The hepatocystic triangle was dissected.  There was some fibrosing chronic cholecystitis that made this challenging.  The cystic duct and artery were circumferentially dissected.  Only translucent fibrofatty  tissue was cauterized.  ICG cholangiography was also used to delineate anatomy.  The posterior one third of the gallbladder was dissected from the cystic plate.  A critical view of safety was obtained with 2 and only 2 tubular structures entering the gallbladder and a view of the cystic plate.  The cystic artery was clipped twice proximally with Hem-o-lok clips and once on the gallbladder side.  The cystic duct was clipped similarly.  Both structures were divided with cold shears.  The gallbladder was then removed from the cystic plate using hook cautery.  This was actually fairly challenging given the size of the gallbladder and the limited mobility.  This massive gallbladder hardly fit in an Endo Catch bag that was brought in through the left upper quadrant port.  The skin incisions were closed with 4-0 Monocryl and Dermabond.    A separate infraumbilical curvilinear incision was made with a 15 blade.  Dissection was carried through the subcutaneous tissue sharply.  The hernia was circumferentially dissected sharply with Metzenbaum scissors.  The hernia measured 3 cm.  The remainder of the omentum was reduced.  The hernia was then closed with interrupted figure-of-eight Ethibond sutures.  The deep dermis was closed interrupted sutures in the deep dermis followed by a subcuticular stitch.  A camera was inserted through the right upper quadrant port and the abdomen was insufflated again.  The closure was satisfactory with no underlying injury or issue.  The skin incisions and then covered with Dermabond.    Electronically signed by Charlie DELENA [place] DOUGLAS, MD on 04/13/2024 at 12:29 PM    "

## 2024-04-13 NOTE — H&P (Signed)
 "04/13/2024  Plan for robotic cholecystectomy today.  He he feels well and does not have any changes since he was last evaluated.    03/01/2024  History of Present Illness  The patient is a 68 year old male who has been diagnosed with gallstones. The diagnosis was confirmed through an ultrasound and a CT scan with contrast, following the detection of elevated liver enzymes in his annual blood work. He is not experiencing any current pain, but he has had two episodes of abdominal discomfort over the past 12 years. Each episode was described as a sensation similar to consuming gasoline and quite severe. The patient is seeking a advice regarding cholecystectomy due to a family history of gallbladder issues, which includes his mother, two sisters, and daughter. He is currently taking psyllium husk, fish oil, and vitamin D supplements.     Past Medical History   No past medical history on file.        Past Surgical History         Past Surgical History:   Procedure Laterality Date    ELBOW SURGERY Right 1975              Current Medication      Current Outpatient Medications:     valsartan -hydroCHLOROthiazide  (DIOVAN -HCT) 160-12.5 MG per tablet, TAKE 1 TABLET BY MOUTH EVERY DAY, Disp: 90 tablet, Rfl: 1         Allergies   No Known Allergies        Social History   Social History            Socioeconomic History    Marital status: Married   Tobacco Use    Smoking status: Never    Smokeless tobacco: Never   Vaping Use    Vaping status: Never Used   Substance and Sexual Activity    Alcohol use: Yes    Drug use: Never    Sexual activity: Yes       Partners: Female      Social Drivers of Health           Physical Activity: Sufficiently Active (03/10/2023)     Exercise Vital Sign      Days of Exercise per Week: 3 days      Minutes of Exercise per Session: 50 min            Family History         Family History   Problem Relation Age of Onset    Heart Disease Father      Diabetes Father              Review of Systems    Constitutional:  Negative for activity change, fever and unexpected weight change.   HENT:  Negative for sore throat and voice change.    Respiratory:  Negative for shortness of breath.    Cardiovascular:  Negative for chest pain and leg swelling.   Gastrointestinal:  Negative for abdominal distention, abdominal pain, constipation, diarrhea and nausea.   Genitourinary:  Negative for difficulty urinating.   Musculoskeletal:  Negative for gait problem.   Skin:  Negative for rash.   Neurological:  Negative for seizures, syncope and weakness.   Hematological:  Does not bruise/bleed easily.        Objective:  Vitals   There were no vitals filed for this visit.     Objective  Physical Exam  Constitutional:       Appearance: Normal appearance.   HENT:  Head: Normocephalic and atraumatic.   Eyes:      General: No scleral icterus.  Cardiovascular:      Rate and Rhythm: Normal rate and regular rhythm.      Heart sounds: Normal heart sounds.   Pulmonary:      Effort: Pulmonary effort is normal.      Breath sounds: Normal breath sounds.   Abdominal:      General: Abdomen is flat. Bowel sounds are normal. There is no distension.      Palpations: Abdomen is soft. There is no mass.      Tenderness: There is no abdominal tenderness. There is no guarding or rebound.      Hernia: No hernia is present.   Musculoskeletal:         General: Normal range of motion.      Cervical back: Neck supple.   Lymphadenopathy:      Cervical: No cervical adenopathy.   Skin:     General: Skin is warm and dry.      Coloration: Skin is not jaundiced.   Neurological:      Mental Status: He is alert and oriented to person, place, and time. Mental status is at baseline.   Psychiatric:         Mood and Affect: Mood normal.         Behavior: Behavior normal.        Assessment & Plan  1.  Chronic cholecystitis with cholelithiasis: Chronic.   - Elevated liver enzymes likely caused secondary inflammation of the liver  - Right upper quadrant ultrasound and  CT imaging confirms gallstones.  (Images personally reviewed)  - Extensive cholelithiasis with likely chronic inflammation possible occlusion of cystic duct.  - Recommend proceeding cholecystectomy due to his history of biliary colic and extensive cholelithiasis.  - Anticipate difficult dissection due to excessive scar tissue.  - Discussed risks: infection, bleeding, injury to surrounding structures, bile duct injury,subtotal procedure or need for further procedures    04/13/2024  Plan for robotic cholecystectomy.  He denies any changes since he was last evaluated.  He feels well this morning.  We discussed the risk minutes of the operation again.  We discussed postoperative course.  He agrees and wished to proceed.  "

## 2024-04-13 NOTE — Anesthesia Pre Procedure (Signed)
 "Department of Anesthesiology  Preprocedure Note       Name:  Stephen Black   Age:  68 y.o.  DOB:  December 31, 1955                                          MRN:  [id number]         Date:  04/13/2024      Surgeon: Clotilde):  [place] Charlie DELENA DOUGLAS, MD    Procedure: Procedure(s):  CHOLECYSTECTOMY LAPAROSCOPIC ROBOTIC DV5    Medications prior to admission:   Prior to Admission medications   Medication Sig Start Date End Date Taking? Authorizing Provider   valsartan -hydroCHLOROthiazide  (DIOVAN -HCT) 160-12.5 MG per tablet TAKE 1 TABLET BY MOUTH EVERY DAY  Patient taking differently: Take 0.5 tablets by mouth daily 01/11/24   Patton Lauraine HERO, MD       Current medications:    No current facility-administered medications for this encounter.     Current Outpatient Medications   Medication Sig Dispense Refill    valsartan -hydroCHLOROthiazide  (DIOVAN -HCT) 160-12.5 MG per tablet TAKE 1 TABLET BY MOUTH EVERY DAY (Patient taking differently: Take 0.5 tablets by mouth daily) 90 tablet 1       Allergies:  No Known Allergies    Problem List:    Patient Active Problem List   Diagnosis Code    Symptomatic cholelithiasis K80.20    Cholelithiasis K80.20       Past Medical History:        Diagnosis Date    Cholecystolithiasis     Hypertension        Past Surgical History:        Procedure Laterality Date    ELBOW SURGERY Right 53    VASECTOMY         Social History:    Social History     Tobacco Use    Smoking status: Never    Smokeless tobacco: Never    Tobacco comments:     OCCASIONAL CIGAR- 5-6 A YEAR   Substance Use Topics    Alcohol use: Yes     Alcohol/week: 10.0 standard drinks of alcohol     Types: 10 Glasses of wine per week                                Counseling given: Not Answered  Tobacco comments: OCCASIONAL CIGAR- 5-6 A YEAR      Vital Signs (Current):   Vitals:    04/06/24 1458   Weight: 88 kg (194 lb)   Height: 1.803 m (5' 11)                                              BP Readings from Last 3 Encounters:   03/12/24  128/78   10/09/23 128/82   03/11/23 136/82       NPO Status:  BMI:   Wt Readings from Last 3 Encounters:   03/12/24 89.8 kg (198 lb)   10/09/23 91.5 kg (201 lb 12.8 oz)   03/11/23 90.3 kg (199 lb)     Body mass index is 27.06 kg/m.    CBC:   Lab Results   Component Value Date/Time    WBC 7.0 11/28/2023 10:00 AM    RBC 4.99 11/28/2023 10:00 AM    HGB 15.7 11/28/2023 10:00 AM    HCT 45.5 11/28/2023 10:00 AM    MCV 91.2 11/28/2023 10:00 AM    RDW 12.8 11/28/2023 10:00 AM    PLT 270 11/28/2023 10:00 AM       CMP:   Lab Results   Component Value Date/Time    NA 137 01/09/2024 01:00 PM    K 4.3 04/12/2024 11:28 AM    CL 98 01/09/2024 01:00 PM    CO2 27 01/09/2024 01:00 PM    BUN 13 01/09/2024 01:00 PM    CREATININE 1.3 01/09/2024 01:00 PM    LABGLOM 61 01/09/2024 01:00 PM    LABGLOM 75 10/18/2021 09:12 AM    GLUCOSE 104 01/09/2024 01:00 PM    CALCIUM 8.8 01/09/2024 01:00 PM    BILITOT 0.30 01/09/2024 01:00 PM    ALKPHOS 78 01/09/2024 01:00 PM    AST 36 01/09/2024 01:00 PM    ALT 68 01/09/2024 01:00 PM       POC Tests: No results for input(s): POCGLU, POCNA, POCK, POCCL, POCBUN, POCHEMO, POCHCT in the last 72 hours.    Coags: No results found for: PROTIME, INR, APTT    HCG (If Applicable): No results found for: PREGTESTUR, PREGSERUM, HCG, HCGQUANT     ABGs: No results found for: PHART, PO2ART, PCO2ART, HCO3ART, BEART, O2SATART     Type & Screen (If Applicable):  No results found for: ABORH, LABANTI    Drug/Infectious Status (If Applicable):  No results found for: HIV, HEPCAB    COVID-19 Screening (If Applicable): No results found for: COVID19        Anesthesia Evaluation    Patient summary reviewed and Nursing notes reviewed     no history of anesthetic complications:  Airway:  Mallampati: II     Neck ROM: full    Mouth opening: > = 3 FB   Dental:  normal exam         Pulmonary:   normal exam               Cardiovascular:     Exercise tolerance: no interval change    (+)     hypertension: no interval change                                            Rhythm: regular  Rate: normal                Neuro/Psych:    Negative Neuro/Psych ROS             GI/Hepatic/Renal:         (-) GERD     Endo/Other:         (-) diabetes mellitus               Abdominal:  normal exam            Vascular:  negative vascular ROS.       Other Findings:  Anesthesia Plan      general     ASA 2       Induction: intravenous.    MIPS: Postoperative opioids intended and Prophylactic antiemetics administered.  Anesthetic plan and risks discussed with patient.      Plan discussed with CRNA.    Attending anesthesiologist reviewed and agrees with Preprocedure content                Garnette MARLA Molt, MD   04/13/2024            "

## 2024-04-13 NOTE — Discharge Instructions (Addendum)
 "      Postoperative Management     May remove dressing at bellybutton and replace after 48 hours with cottonball and gauze.    On a scheduled basis take the following medications together three times per day with food (8hrs in between doses). For example: Take medication at 6:00 am, 2:00 pm, and 10:00 pm.      -Ibuprofen: Take two (2) 200mg  tablets (total 400mg  per dose) by mouth every 8 hours   -Tylenol: Take two (2) 500mg  tablets (total 1000mg  per dose) by mouth every 8 hours       You will be prescribed Oxycodone  5 mg every 6 hours for breakthrough pain. This is to be taken if the pain is unbearable after taking the recommended dosages of Ibuprofen and Tylenol.      Apply ice pack to area of discomfort alternating with 20 minutes on and 20 minutes off until follow up.     Constipation is very common following surgery. We recommend the following regimens:     -Colace 100 mg, take 1-2 capsules daily   -Miralax- take 1 capful once daily. If in 1-2 days you are not having the desired effect, you may take 1 capful twice a day (once in the morning and once in the evening).      May shower immediately. Avoid tub bathing or soaking.    Avoid excessive lifting or vigorous physical exercise.  No lifting greater than 30 pounds for 6 weeks.    General Anesthesia Information:     -If you are unable to urinate by 8 hours post-op (630pm), or at any time feel the urge to void but are unable to, go to the nearest Emergency Room or urgent care.     You received anesthesia today.General anesthesia uses medicine that causes you to become unconscious. The medicine can be inhaled or given through a needle in a vein. It affects the whole body. It keeps you from feeling pain during a procedure and slows down many of your body's normal functions. (For example, you may need help to breathe.)   An anesthesia professional monitors you closely during the procedure. General anesthesia is safe for most people. But some things can increase your  risk of problems. These include; smoking, obesity, and sleep apnea.  Sometimes the bladder is unable to empty after anesthesia. Therefore, if you have not urinated by 8 hours post-op, we recommend you report to the Emergency Room to be evaluated.This is an emergency.        Follow-up care is a key part of your treatment and safety. Be sure to make and go to all appointments, and call your doctor if you are having problems. It's also a good idea to know your test results and keep a list of the medicines you take.    How can you care for yourself at home?    Activity:    For the next 24 hours please have a responsible adult stay with you.  Don't do anything that requires attention to detail until you recover. This includes going to work or school, making important decisions, and signing any legal documents. It takes time for the medicine effects to completely wear off.     For at least 24 hours, do not drive or operate any machinery.     After the procedure, make sure to rest. Some people will feel drowsy or dizzy for up to a few hours after waking up.     Take your  time, and move slowly. Sudden changes in position may cause nausea. When walking, try to keep your eyes open and look straight ahead. Looking down, up, or shaking your head can lead to worsening nausea or even falls.      If you have sleep apnea and you have a CPAP machine, be sure to use it.    Make sure to deep breathe and move your legs around frequently to help prevent pneumonia or blood clots.   Diet    Don't drink alcohol for 24 hours.     You can eat your normal diet, unless your doctor gives you other instructions. If your stomach is upset, try clear liquids and bland, low-fat foods. For example, you can eat plain toast or rice.     Drink plenty of fluids (unless your doctor tells you not to).   It is common to have a headache after anesthesia. This may be related to dehydration or even going without caffeine (if you drink caffeine regularly)      When should you call for help?   Call 911 anytime you think you may need emergency care. For example, call if:    You have trouble breathing.     You passed out (lost consciousness).   Call your doctor now or seek immediate medical care if:    You have nausea or vomiting that gets worse or won't stop.     You have a fever.     You have a new or worse headache.   Watch closely for changes in your health, and be sure to contact your doctor if:    You do not get better as expected.      General Anesthesia Information:     -If you are unable to urinate by 8 hours post-op ( 8:30 PM ), or at any time feel the urge to void but are unable to, go to the nearest Emergency Room or urgent care.     You received anesthesia today.General anesthesia uses medicine that causes you to become unconscious. The medicine can be inhaled or given through a needle in a vein. It affects the whole body. It keeps you from feeling pain during a procedure and slows down many of your body's normal functions. (For example, you may need help to breathe.)   An anesthesia professional monitors you closely during the procedure. General anesthesia is safe for most people. But some things can increase your risk of problems. These include; smoking, obesity, and sleep apnea.  Sometimes the bladder is unable to empty after anesthesia. Therefore, if you have not urinated by 8 hours post-op, we recommend you report to the Emergency Room to be evaluated.This is an emergency.        Follow-up care is a key part of your treatment and safety. Be sure to make and go to all appointments, and call your doctor if you are having problems. It's also a good idea to know your test results and keep a list of the medicines you take.    How can you care for yourself at home?    Activity:    For the next 24 hours please have a responsible adult stay with you.  Don't do anything that requires attention to detail until you recover. This includes going to work or school,  making important decisions, and signing any legal documents. It takes time for the medicine effects to completely wear off.     For at least 24 hours, do not  drive or operate any machinery.     After the procedure, make sure to rest. Some people will feel drowsy or dizzy for up to a few hours after waking up.     Take your time, and move slowly. Sudden changes in position may cause nausea. When walking, try to keep your eyes open and look straight ahead. Looking down, up, or shaking your head can lead to worsening nausea or even falls.      If you have sleep apnea and you have a CPAP machine, be sure to use it.    Make sure to deep breathe and move your legs around frequently to help prevent pneumonia or blood clots.   Diet    Don't drink alcohol for 24 hours.     You can eat your normal diet, unless your doctor gives you other instructions. If your stomach is upset, try clear liquids and bland, low-fat foods. For example, you can eat plain toast or rice.     Drink plenty of fluids (unless your doctor tells you not to).   It is common to have a headache after anesthesia. This may be related to dehydration or even going without caffeine (if you drink caffeine regularly)     When should you call for help?   Call 911 anytime you think you may need emergency care. For example, call if:    You have trouble breathing.     You passed out (lost consciousness).   Call your doctor now or seek immediate medical care if:    You have nausea or vomiting that gets worse or won't stop.     You have a fever.     You have a new or worse headache.   Watch closely for changes in your health, and be sure to contact your doctor if:    You do not get better as expected.       "

## 2024-04-13 NOTE — Anesthesia Postprocedure Evaluation (Signed)
"  Department of Anesthesiology  Postprocedure Note    Patient: Kathryn Linarez  MRN: [id number]  Birthdate: August 03, 1955  Date of evaluation: 04/13/2024    Procedure Summary       Date: 04/13/24 Room / Location: [facility] OR 05 / [facility]    Anesthesia Start: 1032 Anesthesia Stop: 1248    Procedures:       CHOLECYSTECTOMY LAPAROSCOPIC ROBOTIC DV5 (Bilateral: Abdomen)      HERNIA UMBILICAL REPAIR (Abdomen) Diagnosis:       Cholelithiasis      (Cholelithiasis [K80.20])    Surgeons: [place] Charlie DELENA DOUGLAS, MD Responsible Provider: Joshua Garnette POUR, MD    Anesthesia Type: General ASA Status: 2            Anesthesia Type: General    Aldrete Phase I: Aldrete Score: 10    Aldrete Phase II:      Anesthesia Post Evaluation    Patient location during evaluation: PACU  Patient participation: complete - patient participated  Level of consciousness: awake and alert  Pain score: 3  Airway patency: patent  Nausea & Vomiting: no nausea and no vomiting  Cardiovascular status: blood pressure returned to baseline  Respiratory status: acceptable  Hydration status: euvolemic  Multimodal analgesia pain management approach  Pain management: adequate      No notable events documented.  "

## 2024-04-13 NOTE — Anesthesia Procedure Notes (Signed)
"  Airway  Date/Time: 04/13/2024 10:47 AM  Reason: elective    Airway not difficult    General Information and Staff   Patient location during procedure: OR  Performed by: Joelle Leandrew NOVAK, APRN - CRNA  Authorized by: Joshua Garnette POUR, MD        Patient Condition  Indications for airway management: anesthesia and airway protection  Patient position: sniffing  MILS not maintained throughout  Sedation level: deep     Final Airway Details   Preoxygenated: yes  Final airway type: endotracheal airway  Successful airway: ETT  Cuffed: yes   Successful intubation technique: video laryngoscopy  Adjuncts used in placement: intubating stylet  Endotracheal tube insertion site: oral  Blade: Macintosh  Blade size: #3  ETT size (mm): 7.5  Cormack-Lehane Classification: grade I - full view of glottis  Placement verified by: chest auscultation and capnometry   Inital cuff pressure (cm H2O): 6  Measured from: lips  ETT to lips (cm): 22  Ventilation between attempts: bag mask  Number of attempts at approach: 1  Number of other approaches attempted: 0    no      "

## 2024-05-03 ENCOUNTER — Ambulatory Visit: Admit: 2024-05-03 | Discharge: 2024-05-03 | Payer: MEDICARE | Attending: Surgery | Primary: Family Medicine

## 2024-05-03 NOTE — Progress Notes (Signed)
"        05/04/2024  History of Present Illness  69 year old male presents for postoperative follow-up after robotic cholecystectomy on 04/13/2024.    He reports a satisfactory recovery with no significant complications. He experienced rib discomfort, which is more pronounced when rising from a seated position, and has been managing it with Advil and Tylenol. He also reports firmness in his belly button, which he describes as feeling like a stick. He maintains a daily walking routine, covering 3 miles at a slow pace.  He denies fevers, chills, nausea, vomiting.  He is tolerating a diet and having normal bowel function.    Physical Exam  General: Well-developed, well-nourished, no acute distress.  Abdomen: Abdomen soft, non-tender. No hernia recurrence. Incisions well-healed, no infection. Firmness around belly button expected to improve.    Pathology:  Chronic cholecystitis with cholelithiasis    Assessment & Plan  1. Postoperative status following robotic cholecystectomy: Stable. Pathology report indicates chronic cholecystitis. Hernia repair performed during surgery.  - Incisions healing well, no infection or complications.  - Firmness around belly button expected to improve.  - Advised to avoid twisting motions and limit lifting to 30 pounds until 6 weeks post-surgery.  - Can resume normal diet.    Follow-up  - As needed      Charlie DELENA [place] DOUGLAS, MD  "
